# Patient Record
Sex: Female | Born: 1935 | Race: White | Hispanic: No | Marital: Single | Smoking: Current every day smoker
Health system: Southern US, Community
[De-identification: ages and names within clinical notes are randomized; demographics above are authoritative.]

## PROBLEM LIST (undated history)

## (undated) DIAGNOSIS — E785 Hyperlipidemia, unspecified: Secondary | ICD-10-CM

## (undated) DIAGNOSIS — Z8709 Personal history of other diseases of the respiratory system: Secondary | ICD-10-CM

## (undated) DIAGNOSIS — D649 Anemia, unspecified: Secondary | ICD-10-CM

## (undated) DIAGNOSIS — D519 Vitamin B12 deficiency anemia, unspecified: Secondary | ICD-10-CM

## (undated) DIAGNOSIS — I252 Old myocardial infarction: Secondary | ICD-10-CM

## (undated) DIAGNOSIS — N1330 Unspecified hydronephrosis: Secondary | ICD-10-CM

## (undated) DIAGNOSIS — K2980 Duodenitis without bleeding: Secondary | ICD-10-CM

## (undated) DIAGNOSIS — C539 Malignant neoplasm of cervix uteri, unspecified: Secondary | ICD-10-CM

## (undated) DIAGNOSIS — Z87898 Personal history of other specified conditions: Secondary | ICD-10-CM

## (undated) DIAGNOSIS — Z8719 Personal history of other diseases of the digestive system: Secondary | ICD-10-CM

## (undated) DIAGNOSIS — J449 Chronic obstructive pulmonary disease, unspecified: Secondary | ICD-10-CM

## (undated) DIAGNOSIS — D721 Eosinophilia, unspecified: Secondary | ICD-10-CM

## (undated) DIAGNOSIS — R918 Other nonspecific abnormal finding of lung field: Secondary | ICD-10-CM

## (undated) DIAGNOSIS — I2510 Atherosclerotic heart disease of native coronary artery without angina pectoris: Secondary | ICD-10-CM

## (undated) DIAGNOSIS — I509 Heart failure, unspecified: Secondary | ICD-10-CM

## (undated) DIAGNOSIS — Z8701 Personal history of pneumonia (recurrent): Secondary | ICD-10-CM

## (undated) HISTORY — DX: Chronic obstructive pulmonary disease, unspecified: J44.9

## (undated) HISTORY — DX: Atherosclerotic heart disease of native coronary artery without angina pectoris: I25.10

## (undated) HISTORY — DX: Personal history of other diseases of the digestive system: Z87.19

## (undated) HISTORY — DX: Hyperlipidemia, unspecified: E78.5

## (undated) HISTORY — DX: Personal history of other specified conditions: Z87.898

## (undated) HISTORY — DX: Other nonspecific abnormal finding of lung field: R91.8

## (undated) HISTORY — DX: Personal history of pneumonia (recurrent): Z87.01

## (undated) HISTORY — DX: Old myocardial infarction: I25.2

## (undated) HISTORY — DX: Eosinophilia, unspecified: D72.10

## (undated) HISTORY — DX: Vitamin B12 deficiency anemia, unspecified: D51.9

## (undated) HISTORY — DX: Duodenitis without bleeding: K29.80

## (undated) HISTORY — DX: Heart failure, unspecified: I50.9

## (undated) HISTORY — DX: Anemia, unspecified: D64.9

## (undated) HISTORY — DX: Personal history of other diseases of the respiratory system: Z87.09

## (undated) HISTORY — DX: Malignant neoplasm of cervix uteri, unspecified: C53.9

## (undated) HISTORY — DX: Eosinophilia: D72.1

## (undated) HISTORY — DX: Unspecified hydronephrosis: N13.30

---

## 1966-01-31 HISTORY — PX: TOTAL ABDOMINAL HYSTERECTOMY: SHX209

## 1966-01-31 HISTORY — PX: RIGHT OOPHORECTOMY: SHX2359

## 2007-02-01 HISTORY — PX: CATARACT EXTRACTION: SUR2

## 2007-07-12 ENCOUNTER — Ambulatory Visit: Payer: Self-pay | Admitting: Internal Medicine

## 2008-01-16 ENCOUNTER — Ambulatory Visit: Payer: Self-pay | Admitting: Internal Medicine

## 2008-02-01 DIAGNOSIS — Z8719 Personal history of other diseases of the digestive system: Secondary | ICD-10-CM

## 2008-02-01 HISTORY — DX: Personal history of other diseases of the digestive system: Z87.19

## 2008-03-20 ENCOUNTER — Inpatient Hospital Stay: Payer: Self-pay | Admitting: Internal Medicine

## 2008-03-21 ENCOUNTER — Ambulatory Visit: Payer: Self-pay | Admitting: Cardiology

## 2008-03-22 HISTORY — PX: ATHRECTOMY DIRECTIONAL CORONARY: SHX5731

## 2008-03-22 HISTORY — PX: CORONARY ANGIOPLASTY WITH STENT PLACEMENT: SHX49

## 2008-07-15 ENCOUNTER — Ambulatory Visit: Payer: Self-pay | Admitting: Unknown Physician Specialty

## 2008-07-25 ENCOUNTER — Ambulatory Visit: Payer: Self-pay | Admitting: Unknown Physician Specialty

## 2008-07-29 ENCOUNTER — Ambulatory Visit: Payer: Self-pay | Admitting: Unknown Physician Specialty

## 2008-09-08 ENCOUNTER — Ambulatory Visit: Payer: Self-pay | Admitting: Unknown Physician Specialty

## 2009-03-31 ENCOUNTER — Ambulatory Visit: Payer: Self-pay | Admitting: Internal Medicine

## 2009-04-03 ENCOUNTER — Ambulatory Visit: Payer: Self-pay | Admitting: Internal Medicine

## 2009-05-01 ENCOUNTER — Ambulatory Visit: Payer: Self-pay | Admitting: Internal Medicine

## 2009-05-31 ENCOUNTER — Ambulatory Visit: Payer: Self-pay | Admitting: Internal Medicine

## 2009-07-01 ENCOUNTER — Ambulatory Visit: Payer: Self-pay | Admitting: Internal Medicine

## 2009-07-31 ENCOUNTER — Ambulatory Visit: Payer: Self-pay | Admitting: Internal Medicine

## 2009-08-31 ENCOUNTER — Ambulatory Visit: Payer: Self-pay | Admitting: Internal Medicine

## 2009-09-03 ENCOUNTER — Ambulatory Visit: Payer: Self-pay | Admitting: Internal Medicine

## 2009-09-08 ENCOUNTER — Ambulatory Visit: Payer: Self-pay | Admitting: Internal Medicine

## 2009-09-21 ENCOUNTER — Ambulatory Visit: Payer: Self-pay | Admitting: Surgery

## 2009-10-01 ENCOUNTER — Ambulatory Visit: Payer: Self-pay | Admitting: Internal Medicine

## 2009-10-31 ENCOUNTER — Ambulatory Visit: Payer: Self-pay | Admitting: Internal Medicine

## 2009-11-05 ENCOUNTER — Ambulatory Visit: Payer: Self-pay | Admitting: Internal Medicine

## 2009-12-01 ENCOUNTER — Ambulatory Visit: Payer: Self-pay | Admitting: Internal Medicine

## 2009-12-31 ENCOUNTER — Ambulatory Visit: Payer: Self-pay | Admitting: Internal Medicine

## 2010-01-31 ENCOUNTER — Ambulatory Visit: Payer: Self-pay | Admitting: Internal Medicine

## 2010-03-03 ENCOUNTER — Ambulatory Visit: Payer: Self-pay | Admitting: Internal Medicine

## 2010-04-08 ENCOUNTER — Ambulatory Visit: Payer: Self-pay | Admitting: Internal Medicine

## 2010-05-02 ENCOUNTER — Ambulatory Visit: Payer: Self-pay | Admitting: Internal Medicine

## 2010-06-01 ENCOUNTER — Ambulatory Visit: Payer: Self-pay | Admitting: Internal Medicine

## 2010-07-02 ENCOUNTER — Ambulatory Visit: Payer: Self-pay | Admitting: Internal Medicine

## 2010-08-01 ENCOUNTER — Ambulatory Visit: Payer: Self-pay | Admitting: Internal Medicine

## 2010-09-01 ENCOUNTER — Ambulatory Visit: Payer: Self-pay | Admitting: Internal Medicine

## 2010-09-17 ENCOUNTER — Ambulatory Visit: Payer: Self-pay | Admitting: Internal Medicine

## 2010-09-22 ENCOUNTER — Ambulatory Visit: Payer: Self-pay | Admitting: Internal Medicine

## 2010-10-02 ENCOUNTER — Ambulatory Visit: Payer: Self-pay | Admitting: Internal Medicine

## 2010-11-01 ENCOUNTER — Ambulatory Visit: Payer: Self-pay | Admitting: Internal Medicine

## 2010-12-02 ENCOUNTER — Ambulatory Visit: Payer: Self-pay | Admitting: Internal Medicine

## 2011-01-01 ENCOUNTER — Ambulatory Visit: Payer: Self-pay | Admitting: Internal Medicine

## 2011-02-01 ENCOUNTER — Ambulatory Visit: Payer: Self-pay | Admitting: Internal Medicine

## 2011-02-14 LAB — CBC CANCER CENTER
Basophil #: 0.1 x10 3/mm (ref 0.0–0.1)
Basophil %: 0.7 %
Comment - H1-Com1: NORMAL
Eosinophil %: 18.2 %
Eosinophil: 22 %
HCT: 33.9 % — ABNORMAL LOW (ref 35.0–47.0)
HGB: 11.4 g/dL — ABNORMAL LOW (ref 12.0–16.0)
Lymphocyte %: 27.1 %
Lymphocytes: 23 %
MCHC: 33.8 g/dL (ref 32.0–36.0)
Monocyte %: 6.9 %
Monocytes: 6 %
Neutrophil #: 4.2 x10 3/mm (ref 1.4–6.5)
Neutrophil %: 47.1 %
RBC: 2.75 10*6/uL — ABNORMAL LOW (ref 3.80–5.20)
Segmented Neutrophils: 49 %

## 2011-03-04 ENCOUNTER — Ambulatory Visit: Payer: Self-pay | Admitting: Internal Medicine

## 2011-04-20 ENCOUNTER — Ambulatory Visit: Payer: Self-pay | Admitting: Internal Medicine

## 2011-04-20 LAB — CBC CANCER CENTER
Basophil %: 1.1 %
Eosinophil %: 21.2 %
HCT: 34 % — ABNORMAL LOW (ref 35.0–47.0)
HGB: 11.4 g/dL — ABNORMAL LOW (ref 12.0–16.0)
Lymphocyte #: 2.6 x10 3/mm (ref 1.0–3.6)
Lymphocyte %: 28.3 %
MCHC: 33.7 g/dL (ref 32.0–36.0)
MCV: 120 fL — ABNORMAL HIGH (ref 80–100)
Monocyte #: 0.6 x10 3/mm (ref 0.0–0.7)
Neutrophil %: 43.5 %
RDW: 14.6 % — ABNORMAL HIGH (ref 11.5–14.5)
WBC: 9.3 x10 3/mm (ref 3.6–11.0)

## 2011-05-02 ENCOUNTER — Ambulatory Visit: Payer: Self-pay | Admitting: Internal Medicine

## 2011-05-04 LAB — CBC CANCER CENTER
Basophil %: 0.8 %
Eosinophil #: 2.4 x10 3/mm — ABNORMAL HIGH (ref 0.0–0.7)
Eosinophil %: 24.1 %
HGB: 11.2 g/dL — ABNORMAL LOW (ref 12.0–16.0)
Lymphocyte #: 2.7 x10 3/mm (ref 1.0–3.6)
MCH: 40.5 pg — ABNORMAL HIGH (ref 26.0–34.0)
MCHC: 33.6 g/dL (ref 32.0–36.0)
MCV: 121 fL — ABNORMAL HIGH (ref 80–100)
Monocyte #: 0.6 x10 3/mm (ref 0.0–0.7)
Monocyte %: 5.8 %
Neutrophil %: 41.4 %
Platelet: 277 x10 3/mm (ref 150–440)
RDW: 15.9 % — ABNORMAL HIGH (ref 11.5–14.5)
WBC: 9.8 x10 3/mm (ref 3.6–11.0)

## 2011-05-18 LAB — CBC CANCER CENTER
Eosinophil #: 1.8 x10 3/mm — ABNORMAL HIGH (ref 0.0–0.7)
HCT: 31.9 % — ABNORMAL LOW (ref 35.0–47.0)
Lymphocyte #: 2.9 x10 3/mm (ref 1.0–3.6)
Lymphocyte %: 33.1 %
MCH: 40.4 pg — ABNORMAL HIGH (ref 26.0–34.0)
MCHC: 33 g/dL (ref 32.0–36.0)
MCV: 122 fL — ABNORMAL HIGH (ref 80–100)
Monocyte #: 0.6 x10 3/mm (ref 0.2–0.9)
Monocyte %: 6.3 %
WBC: 8.7 x10 3/mm (ref 3.6–11.0)

## 2011-06-01 ENCOUNTER — Ambulatory Visit: Payer: Self-pay | Admitting: Internal Medicine

## 2011-06-01 LAB — IRON AND TIBC
Iron Bind.Cap.(Total): 290 ug/dL (ref 250–450)
Unbound Iron-Bind.Cap.: 205 ug/dL

## 2011-06-01 LAB — CBC CANCER CENTER
Basophil #: 0.1 x10 3/mm (ref 0.0–0.1)
Basophil %: 1.4 %
Eosinophil %: 18.2 %
Lymphocyte %: 25.1 %
MCH: 40.4 pg — ABNORMAL HIGH (ref 26.0–34.0)
MCHC: 32.9 g/dL (ref 32.0–36.0)
MCV: 123 fL — ABNORMAL HIGH (ref 80–100)
Monocyte #: 0.5 x10 3/mm (ref 0.2–0.9)
Monocyte %: 5.8 %
Neutrophil #: 4.3 x10 3/mm (ref 1.4–6.5)
Neutrophil %: 49.5 %
RBC: 2.66 10*6/uL — ABNORMAL LOW (ref 3.80–5.20)
RDW: 15.5 % — ABNORMAL HIGH (ref 11.5–14.5)
WBC: 8.8 x10 3/mm (ref 3.6–11.0)

## 2011-06-02 LAB — PROT IMMUNOELECTROPHORES(ARMC)

## 2011-06-10 ENCOUNTER — Ambulatory Visit: Payer: Self-pay | Admitting: Specialist

## 2011-06-10 LAB — CREATININE, SERUM
Creatinine: 1.04 mg/dL (ref 0.60–1.30)
EGFR (African American): >60

## 2011-06-23 ENCOUNTER — Ambulatory Visit: Payer: Self-pay | Admitting: Specialist

## 2011-06-29 LAB — CBC CANCER CENTER
Eosinophil #: 2.2 x10 3/mm — ABNORMAL HIGH (ref 0.0–0.7)
Eosinophil %: 25.1 %
HGB: 10.8 g/dL — ABNORMAL LOW (ref 12.0–16.0)
MCH: 41.5 pg — ABNORMAL HIGH (ref 26.0–34.0)
MCHC: 32.8 g/dL (ref 32.0–36.0)
Monocyte #: 0.6 x10 3/mm (ref 0.2–0.9)
Neutrophil #: 3.5 x10 3/mm (ref 1.4–6.5)
Neutrophil %: 39.3 %
Platelet: 207 x10 3/mm (ref 150–440)
RDW: 15.8 % — ABNORMAL HIGH (ref 11.5–14.5)
WBC: 8.9 x10 3/mm (ref 3.6–11.0)

## 2011-06-29 LAB — PROTIME-INR: Prothrombin Time: 12.7 secs (ref 11.5–14.7)

## 2011-06-29 LAB — APTT: Activated PTT: 29 secs (ref 23.6–35.9)

## 2011-07-02 ENCOUNTER — Ambulatory Visit: Payer: Self-pay | Admitting: Internal Medicine

## 2011-07-18 ENCOUNTER — Ambulatory Visit: Payer: Self-pay

## 2011-07-18 LAB — BASIC METABOLIC PANEL
Anion Gap: 6 — ABNORMAL LOW (ref 7–16)
Calcium, Total: 8.6 mg/dL (ref 8.5–10.1)
Chloride: 107 mmol/L (ref 98–107)
EGFR (African American): >60
Osmolality: 282 (ref 275–301)
Potassium: 4.8 mmol/L (ref 3.5–5.1)
Sodium: 141 mmol/L (ref 136–145)

## 2011-08-01 ENCOUNTER — Ambulatory Visit: Payer: Self-pay | Admitting: Internal Medicine

## 2011-08-03 LAB — CBC CANCER CENTER
Basophil #: 0.1 x10 3/mm (ref 0.0–0.1)
Eosinophil #: 1.2 x10 3/mm — ABNORMAL HIGH (ref 0.0–0.7)
HCT: 35 % (ref 35.0–47.0)
HGB: 11.3 g/dL — ABNORMAL LOW (ref 12.0–16.0)
Lymphocyte %: 26.8 %
MCHC: 32.3 g/dL (ref 32.0–36.0)
MCV: 129 fL — ABNORMAL HIGH (ref 80–100)
Monocyte #: 0.7 x10 3/mm (ref 0.2–0.9)
Monocyte %: 8 %
Neutrophil #: 4.1 x10 3/mm (ref 1.4–6.5)
RBC: 2.72 10*6/uL — ABNORMAL LOW (ref 3.80–5.20)

## 2011-08-17 LAB — COMPREHENSIVE METABOLIC PANEL
Alkaline Phosphatase: 135 U/L (ref 50–136)
Anion Gap: 9 (ref 7–16)
BUN: 20 mg/dL — ABNORMAL HIGH (ref 7–18)
Calcium, Total: 8.9 mg/dL (ref 8.5–10.1)
Chloride: 103 mmol/L (ref 98–107)
Co2: 27 mmol/L (ref 21–32)
Creatinine: 1.12 mg/dL (ref 0.60–1.30)
Glucose: 100 mg/dL — ABNORMAL HIGH (ref 65–99)
Osmolality: 280 (ref 275–301)
SGOT(AST): 9 U/L — ABNORMAL LOW (ref 15–37)
SGPT (ALT): 14 U/L
Sodium: 139 mmol/L (ref 136–145)

## 2011-08-17 LAB — CBC CANCER CENTER
Basophil #: 0.1 x10 3/mm (ref 0.0–0.1)
Basophil %: 0.9 %
Eosinophil #: 0.9 x10 3/mm — ABNORMAL HIGH (ref 0.0–0.7)
HGB: 11 g/dL — ABNORMAL LOW (ref 12.0–16.0)
Lymphocyte %: 27 %
MCHC: 32.2 g/dL (ref 32.0–36.0)
Neutrophil #: 4.2 x10 3/mm (ref 1.4–6.5)
Neutrophil %: 51.8 %
Platelet: 290 x10 3/mm (ref 150–440)
WBC: 8.2 x10 3/mm (ref 3.6–11.0)

## 2011-08-17 LAB — PROTIME-INR: Prothrombin Time: 13 secs (ref 11.5–14.7)

## 2011-08-17 LAB — APTT: Activated PTT: 31.2 secs (ref 23.6–35.9)

## 2011-08-31 LAB — CBC CANCER CENTER
Basophil %: 0.7 %
Eosinophil %: 19.8 %
HGB: 11.3 g/dL — ABNORMAL LOW (ref 12.0–16.0)
Lymphocyte %: 31.5 %
MCH: 43.2 pg — ABNORMAL HIGH (ref 26.0–34.0)
Monocyte #: 0.4 x10 3/mm (ref 0.2–0.9)
Monocyte %: 5.9 %
Neutrophil %: 42.1 %
RBC: 2.62 10*6/uL — ABNORMAL LOW (ref 3.80–5.20)
WBC: 7.4 x10 3/mm (ref 3.6–11.0)

## 2011-09-01 ENCOUNTER — Ambulatory Visit: Payer: Self-pay | Admitting: Internal Medicine

## 2011-09-01 DIAGNOSIS — N1330 Unspecified hydronephrosis: Secondary | ICD-10-CM

## 2011-09-01 HISTORY — DX: Unspecified hydronephrosis: N13.30

## 2011-09-28 ENCOUNTER — Ambulatory Visit: Payer: Self-pay | Admitting: Internal Medicine

## 2011-09-28 LAB — CBC CANCER CENTER
Basophil %: 1.1 %
Eosinophil %: 17.7 %
HCT: 33.4 % — ABNORMAL LOW (ref 35.0–47.0)
HGB: 11 g/dL — ABNORMAL LOW (ref 12.0–16.0)
Lymphocyte #: 2.5 x10 3/mm (ref 1.0–3.6)
MCV: 129 fL — ABNORMAL HIGH (ref 80–100)
Monocyte %: 6.2 %
Neutrophil #: 2.4 x10 3/mm (ref 1.4–6.5)

## 2011-09-30 ENCOUNTER — Ambulatory Visit: Payer: Self-pay | Admitting: Internal Medicine

## 2011-10-02 ENCOUNTER — Ambulatory Visit: Payer: Self-pay | Admitting: Internal Medicine

## 2011-10-12 ENCOUNTER — Ambulatory Visit: Payer: Self-pay | Admitting: Internal Medicine

## 2011-10-12 LAB — CBC CANCER CENTER
HCT: 33.5 % — ABNORMAL LOW (ref 35.0–47.0)
Lymphocyte #: 2.5 x10 3/mm (ref 1.0–3.6)
MCH: 42.7 pg — ABNORMAL HIGH (ref 26.0–34.0)
MCHC: 33 g/dL (ref 32.0–36.0)
MCV: 130 fL — ABNORMAL HIGH (ref 80–100)
Monocyte %: 6.3 %
Neutrophil #: 3.1 x10 3/mm (ref 1.4–6.5)
Platelet: 180 x10 3/mm (ref 150–440)
RDW: 14.9 % — ABNORMAL HIGH (ref 11.5–14.5)

## 2011-10-18 ENCOUNTER — Encounter: Payer: Self-pay | Admitting: Cardiothoracic Surgery

## 2011-10-26 LAB — CBC CANCER CENTER
Basophil %: 0.7 %
Eosinophil #: 0.9 x10 3/mm — ABNORMAL HIGH (ref 0.0–0.7)
HGB: 10.9 g/dL — ABNORMAL LOW (ref 12.0–16.0)
Lymphocyte #: 2.8 x10 3/mm (ref 1.0–3.6)
Lymphocyte %: 41 %
MCH: 42.6 pg — ABNORMAL HIGH (ref 26.0–34.0)
MCHC: 32.4 g/dL (ref 32.0–36.0)
MCV: 131 fL — ABNORMAL HIGH (ref 80–100)
Monocyte #: 0.5 x10 3/mm (ref 0.2–0.9)
Neutrophil %: 38.2 %
Platelet: 229 x10 3/mm (ref 150–440)

## 2011-11-01 ENCOUNTER — Ambulatory Visit: Payer: Self-pay | Admitting: Internal Medicine

## 2011-11-01 ENCOUNTER — Encounter: Payer: Self-pay | Admitting: Cardiothoracic Surgery

## 2011-11-17 ENCOUNTER — Ambulatory Visit: Payer: Self-pay | Admitting: Internal Medicine

## 2011-11-17 LAB — CREATININE, SERUM
Creatinine: 1.21 mg/dL (ref 0.60–1.30)
EGFR (African American): 51 — ABNORMAL LOW

## 2011-12-01 LAB — CBC CANCER CENTER
Basophil #: 0.1 x10 3/mm (ref 0.0–0.1)
Basophil %: 0.8 %
Eosinophil #: 0.9 x10 3/mm — ABNORMAL HIGH (ref 0.0–0.7)
HCT: 32.5 % — ABNORMAL LOW (ref 35.0–47.0)
Lymphocyte #: 2.2 x10 3/mm (ref 1.0–3.6)
MCH: 43.4 pg — ABNORMAL HIGH (ref 26.0–34.0)
MCHC: 32.5 g/dL (ref 32.0–36.0)
MCV: 134 fL — ABNORMAL HIGH (ref 80–100)
Monocyte #: 0.5 x10 3/mm (ref 0.2–0.9)
Neutrophil #: 2.9 x10 3/mm (ref 1.4–6.5)
RDW: 14.9 % — ABNORMAL HIGH (ref 11.5–14.5)

## 2011-12-02 ENCOUNTER — Encounter: Payer: Self-pay | Admitting: Cardiothoracic Surgery

## 2011-12-02 ENCOUNTER — Ambulatory Visit: Payer: Self-pay | Admitting: Internal Medicine

## 2011-12-26 LAB — FERRITIN: Ferritin (ARMC): 172 ng/mL (ref 8–388)

## 2011-12-26 LAB — CBC CANCER CENTER
Basophil #: 0 x10 3/mm (ref 0.0–0.1)
Eosinophil #: 1 x10 3/mm — ABNORMAL HIGH (ref 0.0–0.7)
Eosinophil %: 15 %
HCT: 31.9 % — ABNORMAL LOW (ref 35.0–47.0)
Lymphocyte #: 2.2 x10 3/mm (ref 1.0–3.6)
MCV: 135 fL — ABNORMAL HIGH (ref 80–100)
Monocyte %: 6.6 %
Neutrophil #: 2.8 x10 3/mm (ref 1.4–6.5)
Neutrophil %: 43.5 %
Platelet: 213 x10 3/mm (ref 150–440)
RBC: 2.36 10*6/uL — ABNORMAL LOW (ref 3.80–5.20)
RDW: 14 % (ref 11.5–14.5)
WBC: 6.5 x10 3/mm (ref 3.6–11.0)

## 2011-12-26 LAB — IRON AND TIBC: Iron Bind.Cap.(Total): 300 ug/dL (ref 250–450)

## 2012-01-01 ENCOUNTER — Ambulatory Visit: Payer: Self-pay | Admitting: Internal Medicine

## 2012-01-01 ENCOUNTER — Encounter: Payer: Self-pay | Admitting: Cardiothoracic Surgery

## 2012-01-19 LAB — CBC CANCER CENTER
Basophil #: 0.1 x10 3/mm (ref 0.0–0.1)
Eosinophil #: 1.1 x10 3/mm — ABNORMAL HIGH (ref 0.0–0.7)
Eosinophil %: 15 %
Lymphocyte #: 2.7 x10 3/mm (ref 1.0–3.6)
MCHC: 33.6 g/dL (ref 32.0–36.0)
MCV: 129 fL — ABNORMAL HIGH (ref 80–100)
Monocyte #: 0.5 x10 3/mm (ref 0.2–0.9)
Platelet: 304 x10 3/mm (ref 150–440)
RDW: 13.8 % (ref 11.5–14.5)
WBC: 7.1 x10 3/mm (ref 3.6–11.0)

## 2012-02-01 ENCOUNTER — Ambulatory Visit: Payer: Self-pay | Admitting: Internal Medicine

## 2012-02-16 LAB — CBC CANCER CENTER
Basophil #: 0.1 x10 3/mm (ref 0.0–0.1)
Basophil %: 1.2 %
Eosinophil #: 1.4 x10 3/mm — ABNORMAL HIGH (ref 0.0–0.7)
Eosinophil %: 18.5 %
HCT: 32.6 % — ABNORMAL LOW (ref 35.0–47.0)
HGB: 11.3 g/dL — ABNORMAL LOW (ref 12.0–16.0)
MCHC: 34.6 g/dL (ref 32.0–36.0)
MCV: 128 fL — ABNORMAL HIGH (ref 80–100)
Neutrophil #: 2.9 x10 3/mm (ref 1.4–6.5)
Neutrophil %: 38.8 %
Platelet: 145 x10 3/mm — ABNORMAL LOW (ref 150–440)
RBC: 2.55 10*6/uL — ABNORMAL LOW (ref 3.80–5.20)
RDW: 14.4 % (ref 11.5–14.5)
WBC: 7.5 x10 3/mm (ref 3.6–11.0)

## 2012-02-27 LAB — CBC CANCER CENTER
Eosinophil %: 12.9 %
HCT: 33 % — ABNORMAL LOW (ref 35.0–47.0)
HGB: 11.1 g/dL — ABNORMAL LOW (ref 12.0–16.0)
Lymphocyte #: 2.1 x10 3/mm (ref 1.0–3.6)
MCHC: 33.7 g/dL (ref 32.0–36.0)
MCV: 129 fL — ABNORMAL HIGH (ref 80–100)
Monocyte #: 0.5 x10 3/mm (ref 0.2–0.9)
Monocyte %: 8.2 %
Neutrophil #: 2.6 x10 3/mm (ref 1.4–6.5)
Neutrophil %: 43.5 %
RBC: 2.55 10*6/uL — ABNORMAL LOW (ref 3.80–5.20)
WBC: 6 x10 3/mm (ref 3.6–11.0)

## 2012-02-27 LAB — CREATININE, SERUM
Creatinine: 1.24 mg/dL (ref 0.60–1.30)
EGFR (African American): 49 — ABNORMAL LOW
EGFR (Non-African Amer.): 42 — ABNORMAL LOW

## 2012-03-03 ENCOUNTER — Ambulatory Visit: Payer: Self-pay | Admitting: Internal Medicine

## 2012-03-31 ENCOUNTER — Ambulatory Visit: Payer: Self-pay | Admitting: Internal Medicine

## 2012-04-16 LAB — CBC CANCER CENTER
Eosinophil #: 1.1 x10 3/mm — ABNORMAL HIGH (ref 0.0–0.7)
HCT: 31.8 % — ABNORMAL LOW (ref 35.0–47.0)
Lymphocyte %: 31.3 %
MCH: 43.7 pg — ABNORMAL HIGH (ref 26.0–34.0)
MCHC: 33.7 g/dL (ref 32.0–36.0)
MCV: 130 fL — ABNORMAL HIGH (ref 80–100)
Monocyte %: 7.6 %
Neutrophil #: 2.4 x10 3/mm (ref 1.4–6.5)
Neutrophil %: 41.7 %
Platelet: 215 x10 3/mm (ref 150–440)
RBC: 2.45 10*6/uL — ABNORMAL LOW (ref 3.80–5.20)
RDW: 15.5 % — ABNORMAL HIGH (ref 11.5–14.5)
WBC: 5.7 x10 3/mm (ref 3.6–11.0)

## 2012-05-01 ENCOUNTER — Ambulatory Visit: Payer: Self-pay | Admitting: Internal Medicine

## 2012-05-17 LAB — CBC CANCER CENTER
Basophil #: 0 x10 3/mm (ref 0.0–0.1)
Basophil %: 0.5 %
Eosinophil #: 0.6 x10 3/mm (ref 0.0–0.7)
Eosinophil %: 8.4 %
HGB: 9.9 g/dL — ABNORMAL LOW (ref 12.0–16.0)
Lymphocyte #: 2.2 x10 3/mm (ref 1.0–3.6)
Lymphocyte %: 28.4 %
MCHC: 33.5 g/dL (ref 32.0–36.0)
MCV: 132 fL — ABNORMAL HIGH (ref 80–100)
Monocyte %: 4.8 %
Platelet: 194 x10 3/mm (ref 150–440)
RBC: 2.24 10*6/uL — ABNORMAL LOW (ref 3.80–5.20)
RDW: 14.4 % (ref 11.5–14.5)
WBC: 7.8 x10 3/mm (ref 3.6–11.0)

## 2012-05-31 ENCOUNTER — Ambulatory Visit: Payer: Self-pay | Admitting: Internal Medicine

## 2012-05-31 LAB — CBC CANCER CENTER
Eosinophil #: 1.1 x10 3/mm — ABNORMAL HIGH (ref 0.0–0.7)
Eosinophil %: 13.8 %
Lymphocyte #: 2.4 x10 3/mm (ref 1.0–3.6)
Lymphocyte %: 29.6 %
MCH: 43.5 pg — ABNORMAL HIGH (ref 26.0–34.0)
MCV: 132 fL — ABNORMAL HIGH (ref 80–100)
Monocyte %: 5.9 %
RBC: 2.34 10*6/uL — ABNORMAL LOW (ref 3.80–5.20)
WBC: 8 x10 3/mm (ref 3.6–11.0)

## 2012-05-31 LAB — IRON AND TIBC
Iron Bind.Cap.(Total): 318 ug/dL (ref 250–450)
Iron Saturation: 27 %
Iron: 87 ug/dL (ref 50–170)

## 2012-05-31 LAB — CREATININE, SERUM
Creatinine: 1.16 mg/dL (ref 0.60–1.30)
EGFR (African American): 53 — ABNORMAL LOW
EGFR (Non-African Amer.): 46 — ABNORMAL LOW

## 2012-05-31 LAB — FERRITIN: Ferritin (ARMC): 161 ng/mL (ref 8–388)

## 2012-06-21 LAB — CBC CANCER CENTER
Basophil #: 0 x10 3/mm (ref 0.0–0.1)
Basophil %: 0.6 %
Eosinophil #: 0.6 x10 3/mm (ref 0.0–0.7)
Eosinophil %: 9.8 %
HCT: 28.9 % — ABNORMAL LOW (ref 35.0–47.0)
HGB: 9.7 g/dL — ABNORMAL LOW (ref 12.0–16.0)
Lymphocyte #: 2.7 x10 3/mm (ref 1.0–3.6)
Lymphocyte %: 43.9 %
MCHC: 33.5 g/dL (ref 32.0–36.0)
MCV: 132 fL — ABNORMAL HIGH (ref 80–100)
Monocyte #: 0.5 x10 3/mm (ref 0.2–0.9)
Monocyte %: 8.1 %
Neutrophil #: 2.4 x10 3/mm (ref 1.4–6.5)
Neutrophil %: 37.6 %
RDW: 15.3 % — ABNORMAL HIGH (ref 11.5–14.5)

## 2012-07-01 ENCOUNTER — Ambulatory Visit: Payer: Self-pay | Admitting: Internal Medicine

## 2012-07-12 LAB — CBC CANCER CENTER
Basophil %: 1.1 %
Eosinophil #: 0.9 x10 3/mm — ABNORMAL HIGH (ref 0.0–0.7)
Eosinophil %: 15.5 %
HCT: 29.9 % — ABNORMAL LOW (ref 35.0–47.0)
HGB: 10.2 g/dL — ABNORMAL LOW (ref 12.0–16.0)
MCV: 132 fL — ABNORMAL HIGH (ref 80–100)
Monocyte #: 0.4 x10 3/mm (ref 0.2–0.9)
Monocyte %: 6.8 %
Neutrophil #: 2.5 x10 3/mm (ref 1.4–6.5)
Neutrophil %: 41.2 %
Platelet: 241 x10 3/mm (ref 150–440)
RBC: 2.27 10*6/uL — ABNORMAL LOW (ref 3.80–5.20)

## 2012-07-31 ENCOUNTER — Ambulatory Visit: Payer: Self-pay | Admitting: Internal Medicine

## 2012-08-20 LAB — CBC CANCER CENTER
Basophil %: 1.4 %
Eosinophil #: 1.1 x10 3/mm — ABNORMAL HIGH (ref 0.0–0.7)
HCT: 29.8 % — ABNORMAL LOW (ref 35.0–47.0)
HGB: 10.2 g/dL — ABNORMAL LOW (ref 12.0–16.0)
Lymphocyte #: 2.2 x10 3/mm (ref 1.0–3.6)
MCHC: 34.1 g/dL (ref 32.0–36.0)
MCV: 128 fL — ABNORMAL HIGH (ref 80–100)
Neutrophil %: 42.3 %
RDW: 14.5 % (ref 11.5–14.5)

## 2012-08-31 ENCOUNTER — Ambulatory Visit: Payer: Self-pay | Admitting: Internal Medicine

## 2012-09-17 LAB — CBC CANCER CENTER
Basophil #: 0.1 x10 3/mm (ref 0.0–0.1)
HGB: 10.2 g/dL — ABNORMAL LOW (ref 12.0–16.0)
Lymphocyte #: 2.1 x10 3/mm (ref 1.0–3.6)
Lymphocyte %: 31.2 %
MCH: 44.2 pg — ABNORMAL HIGH (ref 26.0–34.0)
MCHC: 34.7 g/dL (ref 32.0–36.0)
Monocyte #: 0.5 x10 3/mm (ref 0.2–0.9)
Monocyte %: 8 %
WBC: 6.8 x10 3/mm (ref 3.6–11.0)

## 2012-10-01 ENCOUNTER — Ambulatory Visit: Payer: Self-pay | Admitting: Internal Medicine

## 2012-10-18 LAB — CBC CANCER CENTER
Basophil #: 0.1 x10 3/mm (ref 0.0–0.1)
Basophil %: 1.1 %
Eosinophil #: 1.3 x10 3/mm — ABNORMAL HIGH (ref 0.0–0.7)
Eosinophil %: 16.4 %
HGB: 10.6 g/dL — ABNORMAL LOW (ref 12.0–16.0)
Lymphocyte #: 2.8 x10 3/mm (ref 1.0–3.6)
MCH: 42.1 pg — ABNORMAL HIGH (ref 26.0–34.0)
MCV: 128 fL — ABNORMAL HIGH (ref 80–100)
Monocyte #: 0.4 x10 3/mm (ref 0.2–0.9)
Monocyte %: 5.5 %
Neutrophil #: 3.4 x10 3/mm (ref 1.4–6.5)
Neutrophil %: 42.7 %
Platelet: 323 x10 3/mm (ref 150–440)
RBC: 2.52 10*6/uL — ABNORMAL LOW (ref 3.80–5.20)
WBC: 8 x10 3/mm (ref 3.6–11.0)

## 2012-10-31 ENCOUNTER — Ambulatory Visit: Payer: Self-pay | Admitting: Internal Medicine

## 2012-11-15 LAB — CBC CANCER CENTER
Basophil %: 1 %
Eosinophil #: 1.4 x10 3/mm — ABNORMAL HIGH (ref 0.0–0.7)
Eosinophil %: 14.5 %
HCT: 30 % — ABNORMAL LOW (ref 35.0–47.0)
MCHC: 33.9 g/dL (ref 32.0–36.0)
Neutrophil #: 5.2 x10 3/mm (ref 1.4–6.5)
Neutrophil %: 54.8 %
RDW: 15.3 % — ABNORMAL HIGH (ref 11.5–14.5)

## 2012-12-01 ENCOUNTER — Ambulatory Visit: Payer: Self-pay | Admitting: Internal Medicine

## 2012-12-26 LAB — CREATININE, SERUM: EGFR (Non-African Amer.): 47 — ABNORMAL LOW

## 2012-12-26 LAB — CBC CANCER CENTER
Basophil #: 0.1 x10 3/mm (ref 0.0–0.1)
HCT: 30.9 % — ABNORMAL LOW (ref 35.0–47.0)
Lymphocyte #: 2.4 x10 3/mm (ref 1.0–3.6)
MCV: 129 fL — ABNORMAL HIGH (ref 80–100)
Monocyte #: 0.4 x10 3/mm (ref 0.2–0.9)
Monocyte %: 4.8 %
Platelet: 302 x10 3/mm (ref 150–440)
RDW: 15.9 % — ABNORMAL HIGH (ref 11.5–14.5)
WBC: 8.7 x10 3/mm (ref 3.6–11.0)

## 2012-12-31 ENCOUNTER — Ambulatory Visit: Payer: Self-pay | Admitting: Internal Medicine

## 2012-12-31 DIAGNOSIS — Z8709 Personal history of other diseases of the respiratory system: Secondary | ICD-10-CM

## 2012-12-31 DIAGNOSIS — I252 Old myocardial infarction: Secondary | ICD-10-CM

## 2012-12-31 HISTORY — DX: Old myocardial infarction: I25.2

## 2012-12-31 HISTORY — DX: Personal history of other diseases of the respiratory system: Z87.09

## 2013-01-06 ENCOUNTER — Inpatient Hospital Stay: Payer: Self-pay | Admitting: Internal Medicine

## 2013-01-06 LAB — URINALYSIS, COMPLETE
Bacteria: NONE SEEN
Bilirubin,UR: NEGATIVE
Glucose,UR: NEGATIVE mg/dL (ref 0–75)
Ketone: NEGATIVE
Nitrite: NEGATIVE
Ph: 6 (ref 4.5–8.0)
Protein: NEGATIVE
RBC,UR: <1 /HPF (ref 0–5)
WBC UR: 24 /HPF (ref 0–5)

## 2013-01-06 LAB — COMPREHENSIVE METABOLIC PANEL
Chloride: 103 mmol/L (ref 98–107)
Co2: 24 mmol/L (ref 21–32)
Creatinine: 0.99 mg/dL (ref 0.60–1.30)
Glucose: 120 mg/dL — ABNORMAL HIGH (ref 65–99)
Osmolality: 274 (ref 275–301)
Potassium: 4.8 mmol/L (ref 3.5–5.1)
SGOT(AST): 19 U/L (ref 15–37)
SGPT (ALT): 12 U/L (ref 12–78)
Total Protein: 7.2 g/dL (ref 6.4–8.2)

## 2013-01-06 LAB — CBC
MCHC: 33.6 g/dL (ref 32.0–36.0)
Platelet: 367 10*3/uL (ref 150–440)
RBC: 2.17 10*6/uL — ABNORMAL LOW (ref 3.80–5.20)

## 2013-01-06 LAB — TROPONIN I: Troponin-I: <0.02 ng/mL

## 2013-01-07 LAB — BASIC METABOLIC PANEL
Anion Gap: 9 (ref 7–16)
BUN: 14 mg/dL (ref 7–18)
Calcium, Total: 8.4 mg/dL — ABNORMAL LOW (ref 8.5–10.1)
Creatinine: 0.9 mg/dL (ref 0.60–1.30)
EGFR (African American): >60
EGFR (Non-African Amer.): >60
Glucose: 139 mg/dL — ABNORMAL HIGH (ref 65–99)
Potassium: 4.3 mmol/L (ref 3.5–5.1)
Sodium: 140 mmol/L (ref 136–145)

## 2013-01-07 LAB — CBC WITH DIFFERENTIAL/PLATELET
Eosinophil %: 0 %
HCT: 23.1 % — ABNORMAL LOW (ref 35.0–47.0)
HGB: 7.5 g/dL — ABNORMAL LOW (ref 12.0–16.0)
Lymphocyte %: 4.4 %
MCH: 42 pg — ABNORMAL HIGH (ref 26.0–34.0)
MCHC: 32.4 g/dL (ref 32.0–36.0)
Monocyte %: 2.3 %
Platelet: 319 10*3/uL (ref 150–440)
RDW: 16.1 % — ABNORMAL HIGH (ref 11.5–14.5)
WBC: 11.2 10*3/uL — ABNORMAL HIGH (ref 3.6–11.0)

## 2013-01-07 LAB — MAGNESIUM: Magnesium: 2.2 mg/dL

## 2013-01-07 LAB — IRON AND TIBC
Iron: 32 ug/dL — ABNORMAL LOW (ref 50–170)
Unbound Iron-Bind.Cap.: 179 ug/dL

## 2013-01-07 LAB — FERRITIN: Ferritin (ARMC): 254 ng/mL (ref 8–388)

## 2013-01-08 LAB — CBC WITH DIFFERENTIAL/PLATELET
Basophil #: 0 10*3/uL (ref 0.0–0.1)
Eosinophil #: 0 10*3/uL (ref 0.0–0.7)
Eosinophil %: 0 %
HCT: 25.8 % — ABNORMAL LOW (ref 35.0–47.0)
Lymphocyte #: 0.9 10*3/uL — ABNORMAL LOW (ref 1.0–3.6)
MCV: 130 fL — ABNORMAL HIGH (ref 80–100)
Monocyte #: 0.6 x10 3/mm (ref 0.2–0.9)
Monocyte %: 5 %
Neutrophil #: 11.1 10*3/uL — ABNORMAL HIGH (ref 1.4–6.5)
Neutrophil %: 88.1 %
RBC: 1.99 10*6/uL — ABNORMAL LOW (ref 3.80–5.20)
WBC: 12.6 10*3/uL — ABNORMAL HIGH (ref 3.6–11.0)

## 2013-01-08 LAB — BASIC METABOLIC PANEL
BUN: 24 mg/dL — ABNORMAL HIGH (ref 7–18)
Chloride: 108 mmol/L — ABNORMAL HIGH (ref 98–107)
Creatinine: 1.22 mg/dL (ref 0.60–1.30)
EGFR (African American): 50 — ABNORMAL LOW
EGFR (Non-African Amer.): 43 — ABNORMAL LOW
Glucose: 144 mg/dL — ABNORMAL HIGH (ref 65–99)
Potassium: 4.3 mmol/L (ref 3.5–5.1)
Sodium: 137 mmol/L (ref 136–145)

## 2013-01-08 LAB — CK TOTAL AND CKMB (NOT AT ARMC): CK-MB: 40.2 ng/mL — ABNORMAL HIGH (ref 0.5–3.6)

## 2013-01-08 LAB — MAGNESIUM: Magnesium: 2 mg/dL

## 2013-01-08 LAB — HEPATIC FUNCTION PANEL A (ARMC)
Albumin: 3.2 g/dL — ABNORMAL LOW (ref 3.4–5.0)
Bilirubin, Direct: 0.2 mg/dL (ref 0.00–0.20)
Bilirubin,Total: 0.4 mg/dL (ref 0.2–1.0)
SGOT(AST): 46 U/L — ABNORMAL HIGH (ref 15–37)
Total Protein: 6.6 g/dL (ref 6.4–8.2)

## 2013-01-08 LAB — URINE CULTURE

## 2013-01-08 LAB — LIPASE, BLOOD: Lipase: 77 U/L (ref 73–393)

## 2013-01-09 LAB — CBC WITH DIFFERENTIAL/PLATELET
Basophil #: 0 10*3/uL (ref 0.0–0.1)
Basophil %: 0.1 %
Eosinophil #: 0 10*3/uL (ref 0.0–0.7)
HGB: 7.6 g/dL — ABNORMAL LOW (ref 12.0–16.0)
Lymphocyte %: 2.8 %
MCH: 39.7 pg — ABNORMAL HIGH (ref 26.0–34.0)
MCHC: 31.3 g/dL — ABNORMAL LOW (ref 32.0–36.0)
Monocyte #: 1.4 x10 3/mm — ABNORMAL HIGH (ref 0.2–0.9)
Neutrophil %: 87.5 %
Platelet: 398 10*3/uL (ref 150–440)
RBC: 1.92 10*6/uL — ABNORMAL LOW (ref 3.80–5.20)
WBC: 14.3 10*3/uL — ABNORMAL HIGH (ref 3.6–11.0)

## 2013-01-09 LAB — BASIC METABOLIC PANEL
Anion Gap: 9 (ref 7–16)
BUN: 26 mg/dL — ABNORMAL HIGH (ref 7–18)
Creatinine: 1.03 mg/dL (ref 0.60–1.30)
EGFR (Non-African Amer.): 53 — ABNORMAL LOW
Glucose: 137 mg/dL — ABNORMAL HIGH (ref 65–99)
Osmolality: 284 (ref 275–301)

## 2013-01-09 LAB — CK-MB
CK-MB: 17.7 ng/mL — ABNORMAL HIGH (ref 0.5–3.6)
CK-MB: 14.6 ng/mL — ABNORMAL HIGH (ref 0.5–3.6)

## 2013-01-09 LAB — TROPONIN I
Troponin-I: 3.2 ng/mL — ABNORMAL HIGH
Troponin-I: 3 ng/mL — ABNORMAL HIGH

## 2013-01-10 LAB — CBC WITH DIFFERENTIAL/PLATELET
Bands: 1 %
HCT: 27 % — ABNORMAL LOW (ref 35.0–47.0)
HGB: 8.7 g/dL — ABNORMAL LOW (ref 12.0–16.0)
Lymphocytes: 6 %
MCH: 39.7 pg — ABNORMAL HIGH (ref 26.0–34.0)
MCHC: 32.3 g/dL (ref 32.0–36.0)
NRBC/100 WBC: 1 /
Platelet: 403 10*3/uL (ref 150–440)
RBC: 2.19 10*6/uL — ABNORMAL LOW (ref 3.80–5.20)
Segmented Neutrophils: 84 %
WBC: 13.7 10*3/uL — ABNORMAL HIGH (ref 3.6–11.0)

## 2013-01-10 LAB — LIPID PANEL
HDL Cholesterol: 26 mg/dL — ABNORMAL LOW (ref 40–60)
Ldl Cholesterol, Calc: 61 mg/dL (ref 0–100)
Triglycerides: 153 mg/dL (ref 0–200)
VLDL Cholesterol, Calc: 31 mg/dL (ref 5–40)

## 2013-01-10 LAB — BASIC METABOLIC PANEL
Anion Gap: 7 (ref 7–16)
Calcium, Total: 8.6 mg/dL (ref 8.5–10.1)
Chloride: 104 mmol/L (ref 98–107)
Creatinine: 1.19 mg/dL (ref 0.60–1.30)
EGFR (African American): 51 — ABNORMAL LOW
EGFR (Non-African Amer.): 44 — ABNORMAL LOW
Glucose: 146 mg/dL — ABNORMAL HIGH (ref 65–99)

## 2013-01-10 LAB — TROPONIN I: Troponin-I: 2.7 ng/mL — ABNORMAL HIGH

## 2013-01-10 LAB — CK-MB: CK-MB: 11.3 ng/mL — ABNORMAL HIGH (ref 0.5–3.6)

## 2013-01-11 LAB — CBC WITH DIFFERENTIAL/PLATELET
Bands: 5 %
HCT: 25.4 % — ABNORMAL LOW (ref 35.0–47.0)
Lymphocytes: 15 %
MCH: 40.8 pg — ABNORMAL HIGH (ref 26.0–34.0)
MCHC: 33.1 g/dL (ref 32.0–36.0)
MCV: 123 fL — ABNORMAL HIGH (ref 80–100)
Myelocyte: 1 %
RBC: 2.06 10*6/uL — ABNORMAL LOW (ref 3.80–5.20)
RDW: 22 % — ABNORMAL HIGH (ref 11.5–14.5)
Segmented Neutrophils: 69 %
WBC: 11.3 10*3/uL — ABNORMAL HIGH (ref 3.6–11.0)

## 2013-01-11 LAB — CULTURE, BLOOD (SINGLE)

## 2013-01-11 LAB — EXPECTORATED SPUTUM ASSESSMENT W GRAM STAIN, RFLX TO RESP C

## 2013-01-11 LAB — PHOSPHORUS: Phosphorus: 3.2 mg/dL (ref 2.5–4.9)

## 2013-01-12 LAB — BASIC METABOLIC PANEL
Anion Gap: 5 — ABNORMAL LOW (ref 7–16)
Calcium, Total: 8.4 mg/dL — ABNORMAL LOW (ref 8.5–10.1)
Co2: 29 mmol/L (ref 21–32)
Glucose: 124 mg/dL — ABNORMAL HIGH (ref 65–99)
Osmolality: 285 (ref 275–301)
Potassium: 4.4 mmol/L (ref 3.5–5.1)
Sodium: 138 mmol/L (ref 136–145)

## 2013-01-12 LAB — CBC WITH DIFFERENTIAL/PLATELET
Bands: 5 %
HCT: 24.5 % — ABNORMAL LOW (ref 35.0–47.0)
HGB: 8.1 g/dL — ABNORMAL LOW (ref 12.0–16.0)
Lymphocytes: 8 %
MCH: 40.8 pg — ABNORMAL HIGH (ref 26.0–34.0)
MCHC: 33.2 g/dL (ref 32.0–36.0)
MCV: 123 fL — ABNORMAL HIGH (ref 80–100)
Monocytes: 8 %
Myelocyte: 3 %
NRBC/100 WBC: 3 /
RDW: 21.3 % — ABNORMAL HIGH (ref 11.5–14.5)
Segmented Neutrophils: 75 %
WBC: 9.4 10*3/uL (ref 3.6–11.0)

## 2013-01-12 LAB — MAGNESIUM: Magnesium: 2.8 mg/dL — ABNORMAL HIGH

## 2013-01-13 LAB — CBC WITH DIFFERENTIAL/PLATELET
Bands: 1 %
HCT: 25.1 % — ABNORMAL LOW (ref 35.0–47.0)
MCH: 40.2 pg — ABNORMAL HIGH (ref 26.0–34.0)
Metamyelocyte: 2 %
Monocytes: 7 %
Myelocyte: 3 %
NRBC/100 WBC: 5 /
Platelet: 295 10*3/uL (ref 150–440)
RBC: 2.03 10*6/uL — ABNORMAL LOW (ref 3.80–5.20)
RDW: 21.5 % — ABNORMAL HIGH (ref 11.5–14.5)
Segmented Neutrophils: 72 %
WBC: 9.6 10*3/uL (ref 3.6–11.0)

## 2013-01-16 LAB — CREATININE, SERUM
EGFR (African American): >60
EGFR (Non-African Amer.): >60

## 2013-01-31 ENCOUNTER — Ambulatory Visit: Payer: Self-pay | Admitting: Internal Medicine

## 2013-04-25 ENCOUNTER — Ambulatory Visit: Payer: Self-pay | Admitting: Internal Medicine

## 2013-04-26 LAB — CBC CANCER CENTER
BASOS PCT: 0.6 %
Basophil #: 0.1 x10 3/mm (ref 0.0–0.1)
EOS ABS: 0.3 x10 3/mm (ref 0.0–0.7)
EOS PCT: 4.2 %
HCT: 27.5 % — ABNORMAL LOW (ref 35.0–47.0)
HGB: 8.9 g/dL — ABNORMAL LOW (ref 12.0–16.0)
LYMPHS ABS: 2.6 x10 3/mm (ref 1.0–3.6)
LYMPHS PCT: 32.6 %
MCH: 45.4 pg — ABNORMAL HIGH (ref 26.0–34.0)
MCHC: 32.5 g/dL (ref 32.0–36.0)
MCV: 140 fL — AB (ref 80–100)
Monocyte #: 0.5 x10 3/mm (ref 0.2–0.9)
Monocyte %: 5.6 %
Neutrophil #: 4.6 x10 3/mm (ref 1.4–6.5)
Neutrophil %: 57 %
Platelet: 336 x10 3/mm (ref 150–440)
RBC: 1.96 10*6/uL — ABNORMAL LOW (ref 3.80–5.20)
RDW: 15.4 % — ABNORMAL HIGH (ref 11.5–14.5)
WBC: 8 x10 3/mm (ref 3.6–11.0)

## 2013-04-26 LAB — FERRITIN: Ferritin (ARMC): 714 ng/mL — ABNORMAL HIGH (ref 8–388)

## 2013-04-26 LAB — IRON AND TIBC
IRON BIND. CAP.(TOTAL): 254 ug/dL (ref 250–450)
IRON SATURATION: 36 %
IRON: 91 ug/dL (ref 50–170)
UNBOUND IRON-BIND. CAP.: 163 ug/dL

## 2013-05-01 ENCOUNTER — Ambulatory Visit: Payer: Self-pay | Admitting: Internal Medicine

## 2013-05-06 LAB — CBC CANCER CENTER
BASOS ABS: 0.1 x10 3/mm (ref 0.0–0.1)
Basophil %: 1.4 %
Eosinophil #: 0.7 x10 3/mm (ref 0.0–0.7)
Eosinophil %: 8.4 %
HCT: 32.7 % — ABNORMAL LOW (ref 35.0–47.0)
HGB: 10.9 g/dL — ABNORMAL LOW (ref 12.0–16.0)
Lymphocyte #: 2.1 x10 3/mm (ref 1.0–3.6)
Lymphocyte %: 27.2 %
MCH: 41.4 pg — ABNORMAL HIGH (ref 26.0–34.0)
MCHC: 33.3 g/dL (ref 32.0–36.0)
MCV: 124 fL — ABNORMAL HIGH (ref 80–100)
Monocyte #: 0.5 x10 3/mm (ref 0.2–0.9)
Monocyte %: 5.9 %
Neutrophil #: 4.5 x10 3/mm (ref 1.4–6.5)
Neutrophil %: 57.1 %
Platelet: 411 x10 3/mm (ref 150–440)
RBC: 2.63 10*6/uL — ABNORMAL LOW (ref 3.80–5.20)
RDW: 26.9 % — AB (ref 11.5–14.5)
WBC: 7.8 x10 3/mm (ref 3.6–11.0)

## 2013-05-15 LAB — CBC CANCER CENTER
BASOS ABS: 0.1 x10 3/mm (ref 0.0–0.1)
Basophil %: 1.1 %
EOS ABS: 1.2 x10 3/mm — AB (ref 0.0–0.7)
Eosinophil %: 14.4 %
HCT: 33.7 % — AB (ref 35.0–47.0)
HGB: 10.8 g/dL — AB (ref 12.0–16.0)
LYMPHS PCT: 34.8 %
Lymphocyte #: 3 x10 3/mm (ref 1.0–3.6)
MCH: 40.1 pg — ABNORMAL HIGH (ref 26.0–34.0)
MCHC: 32.2 g/dL (ref 32.0–36.0)
MCV: 125 fL — AB (ref 80–100)
MONOS PCT: 7.5 %
Monocyte #: 0.6 x10 3/mm (ref 0.2–0.9)
Neutrophil #: 3.7 x10 3/mm (ref 1.4–6.5)
Neutrophil %: 42.2 %
Platelet: 353 x10 3/mm (ref 150–440)
RBC: 2.7 10*6/uL — ABNORMAL LOW (ref 3.80–5.20)
RDW: 25.3 % — ABNORMAL HIGH (ref 11.5–14.5)
WBC: 8.7 x10 3/mm (ref 3.6–11.0)

## 2013-05-29 LAB — CBC CANCER CENTER
BASOS ABS: 0.1 x10 3/mm (ref 0.0–0.1)
Basophil %: 1.1 %
EOS PCT: 21.6 %
Eosinophil #: 2.5 x10 3/mm — ABNORMAL HIGH (ref 0.0–0.7)
HCT: 31 % — ABNORMAL LOW (ref 35.0–47.0)
HGB: 10.3 g/dL — ABNORMAL LOW (ref 12.0–16.0)
LYMPHS PCT: 27.6 %
Lymphocyte #: 3.2 x10 3/mm (ref 1.0–3.6)
MCH: 40 pg — AB (ref 26.0–34.0)
MCHC: 33.2 g/dL (ref 32.0–36.0)
MCV: 120 fL — AB (ref 80–100)
MONOS PCT: 5.3 %
Monocyte #: 0.6 x10 3/mm (ref 0.2–0.9)
NEUTROS ABS: 5.1 x10 3/mm (ref 1.4–6.5)
NEUTROS PCT: 44.4 %
Platelet: 326 x10 3/mm (ref 150–440)
RBC: 2.58 10*6/uL — ABNORMAL LOW (ref 3.80–5.20)
RDW: 22.5 % — ABNORMAL HIGH (ref 11.5–14.5)
WBC: 11.5 x10 3/mm — ABNORMAL HIGH (ref 3.6–11.0)

## 2013-05-31 ENCOUNTER — Ambulatory Visit: Payer: Self-pay | Admitting: Internal Medicine

## 2013-06-13 LAB — CBC CANCER CENTER
Basophil #: 0.1 x10 3/mm (ref 0.0–0.1)
Basophil %: 1.3 %
Eosinophil #: 1 x10 3/mm — ABNORMAL HIGH (ref 0.0–0.7)
Eosinophil %: 9.3 %
HCT: 33.2 % — ABNORMAL LOW (ref 35.0–47.0)
HGB: 11.2 g/dL — ABNORMAL LOW (ref 12.0–16.0)
LYMPHS PCT: 32.1 %
Lymphocyte #: 3.5 x10 3/mm (ref 1.0–3.6)
MCH: 40.2 pg — AB (ref 26.0–34.0)
MCHC: 33.9 g/dL (ref 32.0–36.0)
MCV: 119 fL — ABNORMAL HIGH (ref 80–100)
MONO ABS: 0.6 x10 3/mm (ref 0.2–0.9)
Monocyte %: 5.9 %
NEUTROS ABS: 5.6 x10 3/mm (ref 1.4–6.5)
Neutrophil %: 51.4 %
Platelet: 339 x10 3/mm (ref 150–440)
RBC: 2.8 10*6/uL — ABNORMAL LOW (ref 3.80–5.20)
RDW: 21.3 % — ABNORMAL HIGH (ref 11.5–14.5)
WBC: 10.9 x10 3/mm (ref 3.6–11.0)

## 2013-06-24 DIAGNOSIS — I10 Essential (primary) hypertension: Secondary | ICD-10-CM | POA: Insufficient documentation

## 2013-06-24 DIAGNOSIS — E559 Vitamin D deficiency, unspecified: Secondary | ICD-10-CM | POA: Insufficient documentation

## 2013-06-24 DIAGNOSIS — E785 Hyperlipidemia, unspecified: Secondary | ICD-10-CM | POA: Insufficient documentation

## 2013-06-24 DIAGNOSIS — D721 Eosinophilia: Secondary | ICD-10-CM

## 2013-06-24 DIAGNOSIS — J449 Chronic obstructive pulmonary disease, unspecified: Secondary | ICD-10-CM | POA: Insufficient documentation

## 2013-06-24 DIAGNOSIS — R898 Other abnormal findings in specimens from other organs, systems and tissues: Secondary | ICD-10-CM | POA: Insufficient documentation

## 2013-06-24 DIAGNOSIS — N183 Chronic kidney disease, stage 3 unspecified: Secondary | ICD-10-CM | POA: Insufficient documentation

## 2013-06-24 DIAGNOSIS — I2510 Atherosclerotic heart disease of native coronary artery without angina pectoris: Secondary | ICD-10-CM | POA: Insufficient documentation

## 2013-06-25 DIAGNOSIS — D649 Anemia, unspecified: Secondary | ICD-10-CM | POA: Insufficient documentation

## 2013-06-25 DIAGNOSIS — R252 Cramp and spasm: Secondary | ICD-10-CM | POA: Insufficient documentation

## 2013-06-28 LAB — CBC CANCER CENTER
BASOS ABS: 0.1 x10 3/mm (ref 0.0–0.1)
BASOS PCT: 1.2 %
Eosinophil #: 1.1 x10 3/mm — ABNORMAL HIGH (ref 0.0–0.7)
Eosinophil %: 11.8 %
HCT: 33.4 % — ABNORMAL LOW (ref 35.0–47.0)
HGB: 11.1 g/dL — AB (ref 12.0–16.0)
LYMPHS ABS: 2.1 x10 3/mm (ref 1.0–3.6)
Lymphocyte %: 23 %
MCH: 40.2 pg — ABNORMAL HIGH (ref 26.0–34.0)
MCHC: 33.4 g/dL (ref 32.0–36.0)
MCV: 120 fL — AB (ref 80–100)
Monocyte #: 0.5 x10 3/mm (ref 0.2–0.9)
Monocyte %: 6 %
NEUTROS ABS: 5.2 x10 3/mm (ref 1.4–6.5)
Neutrophil %: 58 %
Platelet: 252 x10 3/mm (ref 150–440)
RBC: 2.78 10*6/uL — AB (ref 3.80–5.20)
RDW: 18.6 % — ABNORMAL HIGH (ref 11.5–14.5)
WBC: 8.9 x10 3/mm (ref 3.6–11.0)

## 2013-07-01 ENCOUNTER — Ambulatory Visit: Payer: Self-pay | Admitting: Internal Medicine

## 2013-07-30 DIAGNOSIS — I4891 Unspecified atrial fibrillation: Secondary | ICD-10-CM | POA: Insufficient documentation

## 2013-08-05 ENCOUNTER — Inpatient Hospital Stay: Payer: Self-pay | Admitting: Student

## 2013-08-05 LAB — CBC
HCT: 25.8 % — AB (ref 35.0–47.0)
HGB: 8.5 g/dL — ABNORMAL LOW (ref 12.0–16.0)
MCH: 38.8 pg — ABNORMAL HIGH (ref 26.0–34.0)
MCHC: 32.7 g/dL (ref 32.0–36.0)
MCV: 119 fL — ABNORMAL HIGH (ref 80–100)
Platelet: 389 10*3/uL (ref 150–440)
RBC: 2.18 10*6/uL — ABNORMAL LOW (ref 3.80–5.20)
RDW: 15.7 % — AB (ref 11.5–14.5)
WBC: 13 10*3/uL — ABNORMAL HIGH (ref 3.6–11.0)

## 2013-08-05 LAB — LIPASE, BLOOD: LIPASE: 141 U/L (ref 73–393)

## 2013-08-05 LAB — COMPREHENSIVE METABOLIC PANEL
AST: 14 U/L — AB (ref 15–37)
Albumin: 3.1 g/dL — ABNORMAL LOW (ref 3.4–5.0)
Alkaline Phosphatase: 145 U/L — ABNORMAL HIGH
Anion Gap: 9 (ref 7–16)
BUN: 15 mg/dL (ref 7–18)
Bilirubin,Total: 0.6 mg/dL (ref 0.2–1.0)
CHLORIDE: 102 mmol/L (ref 98–107)
CREATININE: 1.16 mg/dL (ref 0.60–1.30)
Calcium, Total: 8.3 mg/dL — ABNORMAL LOW (ref 8.5–10.1)
Co2: 25 mmol/L (ref 21–32)
GFR CALC AF AMER: 53 — AB
GFR CALC NON AF AMER: 45 — AB
Glucose: 95 mg/dL (ref 65–99)
OSMOLALITY: 273 (ref 275–301)
Potassium: 3.3 mmol/L — ABNORMAL LOW (ref 3.5–5.1)
SGPT (ALT): 9 U/L — ABNORMAL LOW (ref 12–78)
Sodium: 136 mmol/L (ref 136–145)
Total Protein: 7.3 g/dL (ref 6.4–8.2)

## 2013-08-05 LAB — TROPONIN I

## 2013-08-06 LAB — CBC WITH DIFFERENTIAL/PLATELET
BASOS ABS: 0.1 10*3/uL (ref 0.0–0.1)
BASOS ABS: 0.1 10*3/uL (ref 0.0–0.1)
BASOS PCT: 0.6 %
BASOS PCT: 0.3 %
Basophil #: 0 10*3/uL (ref 0.0–0.1)
Basophil #: 0 10*3/uL (ref 0.0–0.1)
Basophil %: 0.5 %
Basophil %: 0.1 %
EOS ABS: 0 10*3/uL (ref 0.0–0.7)
EOS PCT: 5.1 %
EOS PCT: 0 %
Eosinophil #: 0.7 10*3/uL (ref 0.0–0.7)
Eosinophil #: 0.7 10*3/uL (ref 0.0–0.7)
Eosinophil #: 0 10*3/uL (ref 0.0–0.7)
Eosinophil %: 5.8 %
Eosinophil %: 0.1 %
HCT: 23 % — ABNORMAL LOW (ref 35.0–47.0)
HCT: 24.5 % — ABNORMAL LOW (ref 35.0–47.0)
HCT: 24.2 % — ABNORMAL LOW (ref 35.0–47.0)
HCT: 22.8 % — ABNORMAL LOW (ref 35.0–47.0)
HGB: 7.7 g/dL — ABNORMAL LOW (ref 12.0–16.0)
HGB: 7.9 g/dL — ABNORMAL LOW (ref 12.0–16.0)
HGB: 7.7 g/dL — ABNORMAL LOW (ref 12.0–16.0)
HGB: 7.4 g/dL — ABNORMAL LOW (ref 12.0–16.0)
LYMPHS ABS: 0.2 10*3/uL — AB (ref 1.0–3.6)
LYMPHS PCT: 15.6 %
LYMPHS PCT: 2.3 %
LYMPHS PCT: 4.2 %
Lymphocyte #: 1.9 10*3/uL (ref 1.0–3.6)
Lymphocyte #: 1.4 10*3/uL (ref 1.0–3.6)
Lymphocyte #: 0.4 10*3/uL — ABNORMAL LOW (ref 1.0–3.6)
Lymphocyte %: 9.9 %
MCH: 39.8 pg — ABNORMAL HIGH (ref 26.0–34.0)
MCH: 38.4 pg — AB (ref 26.0–34.0)
MCH: 38.3 pg — ABNORMAL HIGH (ref 26.0–34.0)
MCH: 38.5 pg — ABNORMAL HIGH (ref 26.0–34.0)
MCHC: 33.6 g/dL (ref 32.0–36.0)
MCHC: 32 g/dL (ref 32.0–36.0)
MCHC: 32 g/dL (ref 32.0–36.0)
MCHC: 32.3 g/dL (ref 32.0–36.0)
MCV: 118 fL — AB (ref 80–100)
MCV: 120 fL — AB (ref 80–100)
MCV: 120 fL — ABNORMAL HIGH (ref 80–100)
MCV: 119 fL — ABNORMAL HIGH (ref 80–100)
MONO ABS: 0.8 x10 3/mm (ref 0.2–0.9)
MONOS PCT: 0.8 %
Monocyte #: 0.6 x10 3/mm (ref 0.2–0.9)
Monocyte #: 0.1 x10 3/mm — ABNORMAL LOW (ref 0.2–0.9)
Monocyte #: 0.2 x10 3/mm (ref 0.2–0.9)
Monocyte %: 4.8 %
Monocyte %: 5.9 %
Monocyte %: 1.9 %
NEUTROS ABS: 9 10*3/uL — AB (ref 1.4–6.5)
NEUTROS ABS: 8.7 10*3/uL — AB (ref 1.4–6.5)
NEUTROS PCT: 73.2 %
NEUTROS PCT: 93.6 %
Neutrophil #: 10.9 10*3/uL — ABNORMAL HIGH (ref 1.4–6.5)
Neutrophil #: 10.3 10*3/uL — ABNORMAL HIGH (ref 1.4–6.5)
Neutrophil %: 78.6 %
Neutrophil %: 96.7 %
PLATELETS: 317 10*3/uL (ref 150–440)
Platelet: 349 10*3/uL (ref 150–440)
Platelet: 358 10*3/uL (ref 150–440)
Platelet: 326 10*3/uL (ref 150–440)
RBC: 1.95 10*6/uL — AB (ref 3.80–5.20)
RBC: 2.05 10*6/uL — AB (ref 3.80–5.20)
RBC: 2.02 10*6/uL — ABNORMAL LOW (ref 3.80–5.20)
RBC: 1.92 10*6/uL — ABNORMAL LOW (ref 3.80–5.20)
RDW: 15.8 % — AB (ref 11.5–14.5)
RDW: 15.9 % — AB (ref 11.5–14.5)
RDW: 15.8 % — AB (ref 11.5–14.5)
RDW: 16 % — AB (ref 11.5–14.5)
WBC: 12.3 10*3/uL — ABNORMAL HIGH (ref 3.6–11.0)
WBC: 13.8 10*3/uL — AB (ref 3.6–11.0)
WBC: 10.7 10*3/uL (ref 3.6–11.0)
WBC: 9.3 10*3/uL (ref 3.6–11.0)

## 2013-08-06 LAB — URINALYSIS, COMPLETE
BLOOD: NEGATIVE
Bilirubin,UR: NEGATIVE
Glucose,UR: NEGATIVE mg/dL (ref 0–75)
Leukocyte Esterase: NEGATIVE
Nitrite: NEGATIVE
PROTEIN: NEGATIVE
Ph: 5 (ref 4.5–8.0)
RBC,UR: 1 /HPF (ref 0–5)
Specific Gravity: 1.04 (ref 1.003–1.030)
Squamous Epithelial: 10
WBC UR: 1 /HPF (ref 0–5)

## 2013-08-07 LAB — CBC WITH DIFFERENTIAL/PLATELET
Bands: 1 %
HCT: 22.9 % — ABNORMAL LOW (ref 35.0–47.0)
HGB: 7.3 g/dL — ABNORMAL LOW (ref 12.0–16.0)
LYMPHS PCT: 13 %
MCH: 38.2 pg — ABNORMAL HIGH (ref 26.0–34.0)
MCHC: 32.1 g/dL (ref 32.0–36.0)
MCV: 119 fL — AB (ref 80–100)
MONOS PCT: 4 %
Metamyelocyte: 1 %
PLATELETS: 353 10*3/uL (ref 150–440)
RBC: 1.92 10*6/uL — AB (ref 3.80–5.20)
RDW: 16.2 % — ABNORMAL HIGH (ref 11.5–14.5)
Segmented Neutrophils: 81 %
WBC: 9.3 10*3/uL (ref 3.6–11.0)

## 2013-08-08 LAB — CBC WITH DIFFERENTIAL/PLATELET
Bands: 7 %
HCT: 26.2 % — AB (ref 35.0–47.0)
HGB: 8.6 g/dL — ABNORMAL LOW (ref 12.0–16.0)
Lymphocytes: 14 %
MCH: 36.5 pg — ABNORMAL HIGH (ref 26.0–34.0)
MCHC: 32.9 g/dL (ref 32.0–36.0)
MCV: 111 fL — AB (ref 80–100)
MONOS PCT: 2 %
Platelet: 361 10*3/uL (ref 150–440)
RBC: 2.36 10*6/uL — AB (ref 3.80–5.20)
RDW: 24.2 % — ABNORMAL HIGH (ref 11.5–14.5)
SEGMENTED NEUTROPHILS: 77 %
WBC: 12 10*3/uL — ABNORMAL HIGH (ref 3.6–11.0)

## 2013-08-09 LAB — CBC WITH DIFFERENTIAL/PLATELET
BASOS PCT: 0.1 %
Basophil #: 0 10*3/uL (ref 0.0–0.1)
EOS ABS: 0 10*3/uL (ref 0.0–0.7)
EOS PCT: 0 %
HCT: 28.1 % — AB (ref 35.0–47.0)
HGB: 9.3 g/dL — AB (ref 12.0–16.0)
LYMPHS PCT: 8 %
Lymphocyte #: 0.8 10*3/uL — ABNORMAL LOW (ref 1.0–3.6)
MCH: 37.9 pg — ABNORMAL HIGH (ref 26.0–34.0)
MCHC: 33 g/dL (ref 32.0–36.0)
MCV: 115 fL — ABNORMAL HIGH (ref 80–100)
MONO ABS: 0.5 x10 3/mm (ref 0.2–0.9)
Monocyte %: 4.8 %
NEUTROS ABS: 8.5 10*3/uL — AB (ref 1.4–6.5)
Neutrophil %: 87.1 %
Platelet: 369 10*3/uL (ref 150–440)
RBC: 2.45 10*6/uL — ABNORMAL LOW (ref 3.80–5.20)
RDW: 23.2 % — ABNORMAL HIGH (ref 11.5–14.5)
WBC: 9.7 10*3/uL (ref 3.6–11.0)

## 2013-10-01 HISTORY — PX: COLONOSCOPY: SHX174

## 2013-10-01 HISTORY — PX: ESOPHAGOGASTRODUODENOSCOPY: SHX1529

## 2013-10-04 ENCOUNTER — Ambulatory Visit: Payer: Self-pay | Admitting: Internal Medicine

## 2013-10-04 LAB — CBC CANCER CENTER
Basophil #: 0.1 x10 3/mm (ref 0.0–0.1)
Basophil %: 1.1 %
EOS PCT: 11.5 %
Eosinophil #: 1 x10 3/mm — ABNORMAL HIGH (ref 0.0–0.7)
HCT: 27.5 % — ABNORMAL LOW (ref 35.0–47.0)
HGB: 8.8 g/dL — AB (ref 12.0–16.0)
LYMPHS ABS: 3.2 x10 3/mm (ref 1.0–3.6)
Lymphocyte %: 35 %
MCH: 38.3 pg — ABNORMAL HIGH (ref 26.0–34.0)
MCHC: 32.1 g/dL (ref 32.0–36.0)
MCV: 119 fL — ABNORMAL HIGH (ref 80–100)
MONOS PCT: 7.1 %
Monocyte #: 0.6 x10 3/mm (ref 0.2–0.9)
NEUTROS PCT: 45.3 %
Neutrophil #: 4.1 x10 3/mm (ref 1.4–6.5)
Platelet: 430 x10 3/mm (ref 150–440)
RBC: 2.3 10*6/uL — ABNORMAL LOW (ref 3.80–5.20)
RDW: 21.8 % — AB (ref 11.5–14.5)
WBC: 9.1 x10 3/mm (ref 3.6–11.0)

## 2013-10-04 LAB — IRON AND TIBC
IRON: 103 ug/dL (ref 50–170)
Iron Bind.Cap.(Total): 263 ug/dL (ref 250–450)
Iron Saturation: 39 %
UNBOUND IRON-BIND. CAP.: 160 ug/dL

## 2013-10-04 LAB — FERRITIN: Ferritin (ARMC): 798 ng/mL — ABNORMAL HIGH (ref 8–388)

## 2013-10-10 ENCOUNTER — Ambulatory Visit: Payer: Self-pay | Admitting: Gastroenterology

## 2013-10-14 LAB — PATHOLOGY REPORT

## 2013-10-18 LAB — CBC CANCER CENTER
BASOS PCT: 1.6 %
Basophil #: 0.2 x10 3/mm — ABNORMAL HIGH (ref 0.0–0.1)
EOS PCT: 17.3 %
Eosinophil #: 1.8 x10 3/mm — ABNORMAL HIGH (ref 0.0–0.7)
HCT: 26.3 % — AB (ref 35.0–47.0)
HGB: 8.6 g/dL — AB (ref 12.0–16.0)
Lymphocyte #: 4 x10 3/mm — ABNORMAL HIGH (ref 1.0–3.6)
Lymphocyte %: 38.7 %
MCH: 39.4 pg — ABNORMAL HIGH (ref 26.0–34.0)
MCHC: 32.7 g/dL (ref 32.0–36.0)
MCV: 121 fL — ABNORMAL HIGH (ref 80–100)
MONO ABS: 0.6 x10 3/mm (ref 0.2–0.9)
Monocyte %: 5.6 %
Neutrophil #: 3.8 x10 3/mm (ref 1.4–6.5)
Neutrophil %: 36.8 %
PLATELETS: 373 x10 3/mm (ref 150–440)
RBC: 2.18 10*6/uL — ABNORMAL LOW (ref 3.80–5.20)
RDW: 19.7 % — AB (ref 11.5–14.5)
WBC: 10.2 x10 3/mm (ref 3.6–11.0)

## 2013-10-31 ENCOUNTER — Ambulatory Visit: Payer: Self-pay | Admitting: Internal Medicine

## 2013-11-01 LAB — CBC CANCER CENTER
Basophil #: 0.1 x10 3/mm (ref 0.0–0.1)
Basophil %: 1.1 %
EOS ABS: 1.2 x10 3/mm — AB (ref 0.0–0.7)
EOS PCT: 9.3 %
HCT: 27.3 % — ABNORMAL LOW (ref 35.0–47.0)
HGB: 8.5 g/dL — ABNORMAL LOW (ref 12.0–16.0)
LYMPHS ABS: 2 x10 3/mm (ref 1.0–3.6)
Lymphocyte %: 15.3 %
MCH: 38.3 pg — ABNORMAL HIGH (ref 26.0–34.0)
MCHC: 31.3 g/dL — ABNORMAL LOW (ref 32.0–36.0)
MCV: 122 fL — ABNORMAL HIGH (ref 80–100)
MONO ABS: 0.9 x10 3/mm (ref 0.2–0.9)
MONOS PCT: 6.8 %
Neutrophil #: 8.8 x10 3/mm — ABNORMAL HIGH (ref 1.4–6.5)
Neutrophil %: 67.5 %
Platelet: 362 x10 3/mm (ref 150–440)
RBC: 2.23 10*6/uL — ABNORMAL LOW (ref 3.80–5.20)
RDW: 16.7 % — ABNORMAL HIGH (ref 11.5–14.5)
WBC: 13 x10 3/mm — AB (ref 3.6–11.0)

## 2013-11-19 LAB — CBC CANCER CENTER
BASOS ABS: 0.2 x10 3/mm — AB (ref 0.0–0.1)
Basophil %: 1.6 %
Eosinophil #: 2.1 x10 3/mm — ABNORMAL HIGH (ref 0.0–0.7)
Eosinophil %: 17.2 %
HCT: 28.4 % — ABNORMAL LOW (ref 35.0–47.0)
HGB: 9.2 g/dL — AB (ref 12.0–16.0)
LYMPHS PCT: 29.3 %
Lymphocyte #: 3.7 x10 3/mm — ABNORMAL HIGH (ref 1.0–3.6)
MCH: 37.5 pg — ABNORMAL HIGH (ref 26.0–34.0)
MCHC: 32.2 g/dL (ref 32.0–36.0)
MCV: 117 fL — ABNORMAL HIGH (ref 80–100)
Monocyte #: 0.8 x10 3/mm (ref 0.2–0.9)
Monocyte %: 6.1 %
Neutrophil #: 5.7 x10 3/mm (ref 1.4–6.5)
Neutrophil %: 45.8 %
Platelet: 600 x10 3/mm — ABNORMAL HIGH (ref 150–440)
RBC: 2.44 10*6/uL — AB (ref 3.80–5.20)
RDW: 18.1 % — ABNORMAL HIGH (ref 11.5–14.5)
WBC: 12.5 x10 3/mm — ABNORMAL HIGH (ref 3.6–11.0)

## 2013-12-01 ENCOUNTER — Ambulatory Visit: Payer: Self-pay | Admitting: Internal Medicine

## 2013-12-01 DIAGNOSIS — Z8701 Personal history of pneumonia (recurrent): Secondary | ICD-10-CM

## 2013-12-01 HISTORY — DX: Personal history of pneumonia (recurrent): Z87.01

## 2013-12-09 ENCOUNTER — Ambulatory Visit: Payer: Self-pay | Admitting: Internal Medicine

## 2013-12-09 LAB — CBC CANCER CENTER
BASOS ABS: 0.2 x10 3/mm — AB (ref 0.0–0.1)
Basophil %: 1.9 %
Eosinophil #: 3 x10 3/mm — ABNORMAL HIGH (ref 0.0–0.7)
Eosinophil %: 27.1 %
HCT: 29.7 % — AB (ref 35.0–47.0)
HGB: 9.3 g/dL — ABNORMAL LOW (ref 12.0–16.0)
Lymphocyte #: 3.1 x10 3/mm (ref 1.0–3.6)
Lymphocyte %: 27.2 %
MCH: 36.9 pg — ABNORMAL HIGH (ref 26.0–34.0)
MCHC: 31.5 g/dL — ABNORMAL LOW (ref 32.0–36.0)
MCV: 117 fL — AB (ref 80–100)
MONO ABS: 0.7 x10 3/mm (ref 0.2–0.9)
MONOS PCT: 6.4 %
NEUTROS ABS: 4.2 x10 3/mm (ref 1.4–6.5)
Neutrophil %: 37.4 %
Platelet: 420 x10 3/mm (ref 150–440)
RBC: 2.53 10*6/uL — ABNORMAL LOW (ref 3.80–5.20)
RDW: 17.6 % — ABNORMAL HIGH (ref 11.5–14.5)
WBC: 11.3 x10 3/mm — ABNORMAL HIGH (ref 3.6–11.0)

## 2013-12-27 ENCOUNTER — Inpatient Hospital Stay: Payer: Self-pay | Admitting: Internal Medicine

## 2013-12-27 LAB — BASIC METABOLIC PANEL
Anion Gap: 8 (ref 7–16)
BUN: 18 mg/dL (ref 7–18)
CO2: 25 mmol/L (ref 21–32)
Calcium, Total: 8.9 mg/dL (ref 8.5–10.1)
Chloride: 100 mmol/L (ref 98–107)
Creatinine: 1.1 mg/dL (ref 0.60–1.30)
EGFR (African American): >60
EGFR (Non-African Amer.): 51 — ABNORMAL LOW
Glucose: 101 mg/dL — ABNORMAL HIGH (ref 65–99)
Osmolality: 268 (ref 275–301)
POTASSIUM: 4.1 mmol/L (ref 3.5–5.1)
Sodium: 133 mmol/L — ABNORMAL LOW (ref 136–145)

## 2013-12-27 LAB — CBC
HCT: 29 % — ABNORMAL LOW (ref 35.0–47.0)
HGB: 9.5 g/dL — AB (ref 12.0–16.0)
MCH: 36.9 pg — ABNORMAL HIGH (ref 26.0–34.0)
MCHC: 32.7 g/dL (ref 32.0–36.0)
MCV: 113 fL — ABNORMAL HIGH (ref 80–100)
PLATELETS: 335 10*3/uL (ref 150–440)
RBC: 2.57 10*6/uL — ABNORMAL LOW (ref 3.80–5.20)
RDW: 18.8 % — ABNORMAL HIGH (ref 11.5–14.5)
WBC: 22.6 10*3/uL — AB (ref 3.6–11.0)

## 2013-12-27 LAB — TROPONIN I: TROPONIN-I: 0.02 ng/mL

## 2013-12-28 LAB — CBC WITH DIFFERENTIAL/PLATELET
BASOS ABS: 0.1 10*3/uL (ref 0.0–0.1)
Basophil %: 0.3 %
EOS PCT: 0 %
Eosinophil #: 0 10*3/uL (ref 0.0–0.7)
HCT: 24.7 % — AB (ref 35.0–47.0)
HGB: 7.6 g/dL — AB (ref 12.0–16.0)
LYMPHS PCT: 4.7 %
Lymphocyte #: 0.9 10*3/uL — ABNORMAL LOW (ref 1.0–3.6)
MCH: 35.7 pg — ABNORMAL HIGH (ref 26.0–34.0)
MCHC: 30.9 g/dL — AB (ref 32.0–36.0)
MCV: 115 fL — AB (ref 80–100)
MONOS PCT: 6.5 %
Monocyte #: 1.3 x10 3/mm — ABNORMAL HIGH (ref 0.2–0.9)
NEUTROS ABS: 17.4 10*3/uL — AB (ref 1.4–6.5)
NEUTROS PCT: 88.5 %
Platelet: 260 10*3/uL (ref 150–440)
RBC: 2.14 10*6/uL — AB (ref 3.80–5.20)
RDW: 18.5 % — ABNORMAL HIGH (ref 11.5–14.5)
WBC: 19.7 10*3/uL — ABNORMAL HIGH (ref 3.6–11.0)

## 2013-12-28 LAB — BASIC METABOLIC PANEL
Anion Gap: 6 — ABNORMAL LOW (ref 7–16)
BUN: 18 mg/dL (ref 7–18)
CALCIUM: 8.4 mg/dL — AB (ref 8.5–10.1)
CHLORIDE: 104 mmol/L (ref 98–107)
CO2: 26 mmol/L (ref 21–32)
Creatinine: 1.17 mg/dL (ref 0.60–1.30)
EGFR (Non-African Amer.): 48 — ABNORMAL LOW
GFR CALC AF AMER: 58 — AB
Glucose: 110 mg/dL — ABNORMAL HIGH (ref 65–99)
Osmolality: 274 (ref 275–301)
Potassium: 4.1 mmol/L (ref 3.5–5.1)
Sodium: 136 mmol/L (ref 136–145)

## 2013-12-28 LAB — LACTATE DEHYDROGENASE: LDH: 145 U/L (ref 81–246)

## 2013-12-28 LAB — FERRITIN: FERRITIN (ARMC): 992 ng/mL — AB (ref 8–388)

## 2013-12-28 LAB — IRON AND TIBC
IRON BIND. CAP.(TOTAL): 176 ug/dL — AB (ref 250–450)
Iron Saturation: 7 %
Iron: 13 ug/dL — ABNORMAL LOW (ref 50–170)
UNBOUND IRON-BIND. CAP.: 163 ug/dL

## 2013-12-28 LAB — RETICULOCYTES
ABSOLUTE RETIC COUNT: 0.028 10*6/uL (ref 0.019–0.186)
RETICULOCYTE: 1.3 % (ref 0.4–3.1)

## 2013-12-28 LAB — FOLATE: Folic Acid: 9.5 ng/mL (ref 3.1–100.0)

## 2013-12-29 LAB — BASIC METABOLIC PANEL
Anion Gap: 8 (ref 7–16)
BUN: 19 mg/dL — ABNORMAL HIGH (ref 7–18)
CHLORIDE: 103 mmol/L (ref 98–107)
CO2: 25 mmol/L (ref 21–32)
Calcium, Total: 8.7 mg/dL (ref 8.5–10.1)
Creatinine: 1.14 mg/dL (ref 0.60–1.30)
GFR CALC AF AMER: 60 — AB
GFR CALC NON AF AMER: 49 — AB
Glucose: 101 mg/dL — ABNORMAL HIGH (ref 65–99)
OSMOLALITY: 274 (ref 275–301)
Potassium: 3.7 mmol/L (ref 3.5–5.1)
Sodium: 136 mmol/L (ref 136–145)

## 2013-12-29 LAB — CBC WITH DIFFERENTIAL/PLATELET
Basophil #: 0.1 10*3/uL (ref 0.0–0.1)
Basophil %: 0.7 %
EOS PCT: 0.5 %
Eosinophil #: 0.1 10*3/uL (ref 0.0–0.7)
HCT: 25.3 % — ABNORMAL LOW (ref 35.0–47.0)
HGB: 8.1 g/dL — ABNORMAL LOW (ref 12.0–16.0)
LYMPHS ABS: 1.1 10*3/uL (ref 1.0–3.6)
Lymphocyte %: 6.7 %
MCH: 36.1 pg — ABNORMAL HIGH (ref 26.0–34.0)
MCHC: 32 g/dL (ref 32.0–36.0)
MCV: 113 fL — AB (ref 80–100)
MONOS PCT: 6 %
Monocyte #: 1 x10 3/mm — ABNORMAL HIGH (ref 0.2–0.9)
NEUTROS PCT: 86.1 %
Neutrophil #: 13.7 10*3/uL — ABNORMAL HIGH (ref 1.4–6.5)
Platelet: 275 10*3/uL (ref 150–440)
RBC: 2.24 10*6/uL — AB (ref 3.80–5.20)
RDW: 18.1 % — ABNORMAL HIGH (ref 11.5–14.5)
WBC: 15.8 10*3/uL — ABNORMAL HIGH (ref 3.6–11.0)

## 2013-12-30 LAB — CBC WITH DIFFERENTIAL/PLATELET
Basophil #: 0.1 10*3/uL (ref 0.0–0.1)
Basophil %: 0.5 %
Eosinophil #: 0.2 10*3/uL (ref 0.0–0.7)
Eosinophil %: 1.8 %
HCT: 22.5 % — ABNORMAL LOW (ref 35.0–47.0)
HGB: 7.2 g/dL — ABNORMAL LOW (ref 12.0–16.0)
LYMPHS ABS: 1.2 10*3/uL (ref 1.0–3.6)
Lymphocyte %: 11 %
MCH: 36.1 pg — AB (ref 26.0–34.0)
MCHC: 32.1 g/dL (ref 32.0–36.0)
MCV: 112 fL — ABNORMAL HIGH (ref 80–100)
Monocyte #: 0.8 x10 3/mm (ref 0.2–0.9)
Monocyte %: 6.8 %
Neutrophil #: 9 10*3/uL — ABNORMAL HIGH (ref 1.4–6.5)
Neutrophil %: 79.9 %
Platelet: 279 10*3/uL (ref 150–440)
RBC: 2 10*6/uL — ABNORMAL LOW (ref 3.80–5.20)
RDW: 18.6 % — ABNORMAL HIGH (ref 11.5–14.5)
WBC: 11.2 10*3/uL — ABNORMAL HIGH (ref 3.6–11.0)

## 2013-12-30 LAB — BASIC METABOLIC PANEL
Anion Gap: 8 (ref 7–16)
BUN: 17 mg/dL (ref 7–18)
CHLORIDE: 103 mmol/L (ref 98–107)
Calcium, Total: 8.5 mg/dL (ref 8.5–10.1)
Co2: 26 mmol/L (ref 21–32)
Creatinine: 1.09 mg/dL (ref 0.60–1.30)
EGFR (Non-African Amer.): 52 — ABNORMAL LOW
Glucose: 99 mg/dL (ref 65–99)
OSMOLALITY: 275 (ref 275–301)
Potassium: 3.6 mmol/L (ref 3.5–5.1)
SODIUM: 137 mmol/L (ref 136–145)

## 2013-12-30 LAB — HEPATIC FUNCTION PANEL A (ARMC)
ALK PHOS: 179 U/L — AB
Albumin: 2.7 g/dL — ABNORMAL LOW (ref 3.4–5.0)
BILIRUBIN DIRECT: 0.2 mg/dL (ref 0.0–0.2)
BILIRUBIN TOTAL: 0.6 mg/dL (ref 0.2–1.0)
SGOT(AST): 20 U/L (ref 15–37)
SGPT (ALT): 15 U/L
Total Protein: 6.2 g/dL — ABNORMAL LOW (ref 6.4–8.2)

## 2013-12-30 LAB — LIPASE, BLOOD: Lipase: 81 U/L (ref 73–393)

## 2013-12-30 LAB — URINE IEP, RANDOM

## 2013-12-31 ENCOUNTER — Ambulatory Visit: Payer: Self-pay | Admitting: Internal Medicine

## 2013-12-31 LAB — CBC WITH DIFFERENTIAL/PLATELET
Basophil #: 0.1 10*3/uL (ref 0.0–0.1)
Basophil %: 0.7 %
EOS ABS: 1.4 10*3/uL — AB (ref 0.0–0.7)
EOS PCT: 12.4 %
HCT: 28 % — ABNORMAL LOW (ref 35.0–47.0)
HGB: 9.3 g/dL — ABNORMAL LOW (ref 12.0–16.0)
LYMPHS PCT: 17.9 %
Lymphocyte #: 2 10*3/uL (ref 1.0–3.6)
MCH: 35.4 pg — ABNORMAL HIGH (ref 26.0–34.0)
MCHC: 33.1 g/dL (ref 32.0–36.0)
MCV: 107 fL — AB (ref 80–100)
MONO ABS: 0.8 x10 3/mm (ref 0.2–0.9)
Monocyte %: 7.5 %
Neutrophil #: 6.7 10*3/uL — ABNORMAL HIGH (ref 1.4–6.5)
Neutrophil %: 61.5 %
Platelet: 337 10*3/uL (ref 150–440)
RBC: 2.61 10*6/uL — ABNORMAL LOW (ref 3.80–5.20)
RDW: 23 % — AB (ref 11.5–14.5)
WBC: 10.9 10*3/uL (ref 3.6–11.0)

## 2013-12-31 LAB — PROT IMMUNOELECTROPHORES(ARMC)

## 2014-01-01 ENCOUNTER — Ambulatory Visit: Payer: Self-pay | Admitting: Internal Medicine

## 2014-01-01 LAB — CULTURE, BLOOD (SINGLE)

## 2014-01-01 LAB — EXPECTORATED SPUTUM ASSESSMENT W REFEX TO RESP CULTURE

## 2014-01-06 LAB — CBC CANCER CENTER
BASOS PCT: 2.2 %
Basophil #: 0.2 x10 3/mm — ABNORMAL HIGH (ref 0.0–0.1)
EOS PCT: 14.3 %
Eosinophil #: 1.4 x10 3/mm — ABNORMAL HIGH (ref 0.0–0.7)
HCT: 31.2 % — ABNORMAL LOW (ref 35.0–47.0)
HGB: 10.1 g/dL — ABNORMAL LOW (ref 12.0–16.0)
Lymphocyte #: 2.7 x10 3/mm (ref 1.0–3.6)
Lymphocyte %: 26.3 %
MCH: 34.5 pg — ABNORMAL HIGH (ref 26.0–34.0)
MCHC: 32.3 g/dL (ref 32.0–36.0)
MCV: 107 fL — AB (ref 80–100)
Monocyte #: 0.7 x10 3/mm (ref 0.2–0.9)
Monocyte %: 7.3 %
Neutrophil #: 5 x10 3/mm (ref 1.4–6.5)
Neutrophil %: 49.9 %
Platelet: 436 x10 3/mm (ref 150–440)
RBC: 2.92 10*6/uL — ABNORMAL LOW (ref 3.80–5.20)
RDW: 21.5 % — AB (ref 11.5–14.5)
WBC: 10.1 x10 3/mm (ref 3.6–11.0)

## 2014-01-08 DIAGNOSIS — K2970 Gastritis, unspecified, without bleeding: Secondary | ICD-10-CM | POA: Insufficient documentation

## 2014-01-08 DIAGNOSIS — D472 Monoclonal gammopathy: Secondary | ICD-10-CM | POA: Insufficient documentation

## 2014-01-30 LAB — CBC CANCER CENTER
BASOS ABS: 0.1 x10 3/mm (ref 0.0–0.1)
Basophil %: 1.1 %
EOS PCT: 6 %
Eosinophil #: 0.6 x10 3/mm (ref 0.0–0.7)
HCT: 31.2 % — ABNORMAL LOW (ref 35.0–47.0)
HGB: 10.1 g/dL — ABNORMAL LOW (ref 12.0–16.0)
LYMPHS ABS: 2.2 x10 3/mm (ref 1.0–3.6)
Lymphocyte %: 20.8 %
MCH: 35.3 pg — AB (ref 26.0–34.0)
MCHC: 32.5 g/dL (ref 32.0–36.0)
MCV: 109 fL — ABNORMAL HIGH (ref 80–100)
Monocyte #: 0.8 x10 3/mm (ref 0.2–0.9)
Monocyte %: 7.4 %
NEUTROS ABS: 7 x10 3/mm — AB (ref 1.4–6.5)
Neutrophil %: 64.7 %
PLATELETS: 410 x10 3/mm (ref 150–440)
RBC: 2.87 10*6/uL — ABNORMAL LOW (ref 3.80–5.20)
RDW: 23 % — ABNORMAL HIGH (ref 11.5–14.5)
WBC: 10.8 x10 3/mm (ref 3.6–11.0)

## 2014-01-31 ENCOUNTER — Ambulatory Visit: Payer: Self-pay | Admitting: Internal Medicine

## 2014-02-02 LAB — KAPPA/LAMBDA FREE LIGHT CHAINS (ARMC)

## 2014-04-23 ENCOUNTER — Ambulatory Visit
Admit: 2014-04-23 | Disposition: A | Discharge status: Home / Self Care | Payer: Self-pay | Attending: Internal Medicine | Admitting: Internal Medicine

## 2014-04-25 LAB — CBC CANCER CENTER
Basophil #: 0.1 x10 3/mm (ref 0.0–0.1)
Basophil %: 1.3 %
Eosinophil #: 1.8 x10 3/mm — ABNORMAL HIGH (ref 0.0–0.7)
Eosinophil %: 18.8 %
HCT: 29.5 % — ABNORMAL LOW (ref 35.0–47.0)
HGB: 9.7 g/dL — ABNORMAL LOW (ref 12.0–16.0)
Lymphocyte #: 2.8 x10 3/mm (ref 1.0–3.6)
Lymphocyte %: 29.5 %
MCH: 36.3 pg — ABNORMAL HIGH (ref 26.0–34.0)
MCHC: 32.8 g/dL (ref 32.0–36.0)
MCV: 110 fL — ABNORMAL HIGH (ref 80–100)
Monocyte #: 0.6 x10 3/mm (ref 0.2–0.9)
Monocyte %: 6 %
Neutrophil #: 4.2 x10 3/mm (ref 1.4–6.5)
Neutrophil %: 44.4 %
Platelet: 398 x10 3/mm (ref 150–440)
RBC: 2.66 10*6/uL — ABNORMAL LOW (ref 3.80–5.20)
RDW: 18.9 % — ABNORMAL HIGH (ref 11.5–14.5)
WBC: 9.5 x10 3/mm (ref 3.6–11.0)

## 2014-04-25 LAB — RETICULOCYTES
Absolute Retic Count: 0.037 10*6/uL (ref 0.019–0.186)
Reticulocyte: 1.4 % (ref 0.4–3.1)

## 2014-05-02 ENCOUNTER — Ambulatory Visit
Admit: 2014-05-02 | Disposition: A | Discharge status: Home / Self Care | Payer: Self-pay | Attending: Internal Medicine | Admitting: Internal Medicine

## 2014-05-04 LAB — COMPREHENSIVE METABOLIC PANEL
ALK PHOS: 92 U/L
ALT: 10 U/L — AB
ANION GAP: 11 (ref 7–16)
Albumin: 4.8 g/dL
BILIRUBIN TOTAL: 1 mg/dL
BUN: 14 mg/dL
CO2: 26 mmol/L
Calcium, Total: 9.4 mg/dL
Chloride: 101 mmol/L
Creatinine: 1.02 mg/dL — ABNORMAL HIGH
EGFR (African American): >60
EGFR (Non-African Amer.): 53 — ABNORMAL LOW
Glucose: 124 mg/dL — ABNORMAL HIGH
POTASSIUM: 3.9 mmol/L
SGOT(AST): 16 U/L
Sodium: 138 mmol/L
TOTAL PROTEIN: 7.8 g/dL

## 2014-05-04 LAB — URINALYSIS, COMPLETE
Bilirubin,UR: NEGATIVE
Blood: NEGATIVE
Glucose,UR: NEGATIVE mg/dL (ref 0–75)
Leukocyte Esterase: NEGATIVE
NITRITE: NEGATIVE
PH: 7 (ref 4.5–8.0)
RBC,UR: <1 /HPF (ref 0–5)
SPECIFIC GRAVITY: 1.012 (ref 1.003–1.030)
WBC UR: 1 /HPF (ref 0–5)

## 2014-05-04 LAB — CBC WITH DIFFERENTIAL/PLATELET
BASOS ABS: 0.1 10*3/uL (ref 0.0–0.1)
Basophil %: 1 %
Eosinophil #: 0 10*3/uL (ref 0.0–0.7)
Eosinophil %: 0.1 %
HCT: 34 % — ABNORMAL LOW (ref 35.0–47.0)
HGB: 10.8 g/dL — ABNORMAL LOW (ref 12.0–16.0)
LYMPHS PCT: 7 %
Lymphocyte #: 1 10*3/uL (ref 1.0–3.6)
MCH: 35 pg — ABNORMAL HIGH (ref 26.0–34.0)
MCHC: 31.9 g/dL — ABNORMAL LOW (ref 32.0–36.0)
MCV: 110 fL — ABNORMAL HIGH (ref 80–100)
MONOS PCT: 3.1 %
Monocyte #: 0.4 x10 3/mm (ref 0.2–0.9)
NEUTROS ABS: 12.5 10*3/uL — AB (ref 1.4–6.5)
NEUTROS PCT: 88.8 %
Platelet: 377 10*3/uL (ref 150–440)
RBC: 3.09 10*6/uL — ABNORMAL LOW (ref 3.80–5.20)
RDW: 18.9 % — AB (ref 11.5–14.5)
WBC: 14.1 10*3/uL — ABNORMAL HIGH (ref 3.6–11.0)

## 2014-05-04 LAB — TROPONIN I: Troponin-I: 0.14 ng/mL — ABNORMAL HIGH

## 2014-05-04 LAB — LIPASE, BLOOD: LIPASE: 54 U/L — AB

## 2014-05-05 ENCOUNTER — Inpatient Hospital Stay
Admit: 2014-05-05 | Disposition: A | Discharge status: Home / Self Care | Payer: Self-pay | Attending: Internal Medicine | Admitting: Internal Medicine

## 2014-05-05 LAB — CBC WITH DIFFERENTIAL/PLATELET
BASOS ABS: 0.1 10*3/uL (ref 0.0–0.1)
BASOS PCT: 0.9 %
EOS ABS: 0.1 10*3/uL (ref 0.0–0.7)
Eosinophil %: 0.9 %
HCT: 28.8 % — AB (ref 35.0–47.0)
HGB: 9.2 g/dL — ABNORMAL LOW (ref 12.0–16.0)
Lymphocyte #: 2.9 10*3/uL (ref 1.0–3.6)
Lymphocyte %: 28.5 %
MCH: 35 pg — ABNORMAL HIGH (ref 26.0–34.0)
MCHC: 31.8 g/dL — AB (ref 32.0–36.0)
MCV: 110 fL — ABNORMAL HIGH (ref 80–100)
MONO ABS: 0.7 x10 3/mm (ref 0.2–0.9)
Monocyte %: 7 %
NEUTROS ABS: 6.3 10*3/uL (ref 1.4–6.5)
Neutrophil %: 62.7 %
Platelet: 337 10*3/uL (ref 150–440)
RBC: 2.62 10*6/uL — ABNORMAL LOW (ref 3.80–5.20)
RDW: 19.4 % — AB (ref 11.5–14.5)
WBC: 10.1 10*3/uL (ref 3.6–11.0)

## 2014-05-05 LAB — COMPREHENSIVE METABOLIC PANEL
AST: 13 U/L — AB
Albumin: 3.8 g/dL
Alkaline Phosphatase: 78 U/L
Anion Gap: 9 (ref 7–16)
BUN: 13 mg/dL
Bilirubin,Total: 0.9 mg/dL
CALCIUM: 8.5 mg/dL — AB
Chloride: 103 mmol/L
Co2: 26 mmol/L
Creatinine: 0.94 mg/dL
EGFR (Non-African Amer.): 58 — ABNORMAL LOW
Glucose: 95 mg/dL
POTASSIUM: 3.8 mmol/L
SGPT (ALT): 7 U/L — ABNORMAL LOW
SODIUM: 138 mmol/L
TOTAL PROTEIN: 6.2 g/dL — AB

## 2014-05-05 LAB — TROPONIN I
Troponin-I: 0.17 ng/mL — ABNORMAL HIGH
Troponin-I: 0.15 ng/mL — ABNORMAL HIGH

## 2014-05-05 LAB — PROTIME-INR
INR: 1.2
Prothrombin Time: 14.9 secs

## 2014-05-05 LAB — APTT: ACTIVATED PTT: 32.6 s (ref 23.6–35.9)

## 2014-05-06 ENCOUNTER — Ambulatory Visit
Admit: 2014-05-06 | Disposition: A | Discharge status: Home / Self Care | Payer: Self-pay | Attending: Internal Medicine | Admitting: Internal Medicine

## 2014-05-23 NOTE — Discharge Summary (Signed)
PATIENT NAME:  Kara Clark, ALVIAR MR#:  [id number] DATE OF BIRTH:  02/17/1934  DATE OF ADMISSION:  01/06/2013 DATE OF DISCHARGE:  01/17/2013.   PRIMARY CARE PHYSICIAN:  Dr. Raechel Ache  FINAL DIAGNOSES:  1.  Acute on chronic respiratory failure, now requiring 3 L of oxygen upon discharge.  2.  Clinical sepsis with pneumonia; strep pneumoniae growing out of the sputum culture.  3.  Acute systolic congestive heart failure.  4.  Acute myocardial infarctions.  5.  Constipation.  6.  A history of lung nodules.  7.  Eosinophilia history.  8  A history of gastrointestinal bleed in the past.  9.  Anemia.  10.  Weakness.  11.  Rapid atrial fibrillation, converted to sinus rhythm.   MEDICATIONS ON DISCHARGE: Include:  1.  Ranitidine 150 mg twice a day.  2.  Vitamin D3, 2000 international units daily.  3.  Advair Diskus 150/50, 1 inhalation twice a day.  4.  Magnesium oxide 400 mg 1 tablet daily.  5.  Carafate 1 gram twice a day.  6.  Spiriva 1 inhalation daily.  7.  Plavix 75 mg daily.  8.  Acetaminophen 2 tablets every 4 hours as needed for pain or temperature.  9.  Simvastatin 20 mg at bedtime.  10.  Hydroxyurea 50 mg 6 days a week, every day except for Sunday.  11.  Xanax 0.25 mg every 8 hours as needed for anxiety.  12.  Metoprolol tartrate 25 mg twice a day.  13.  Witch hazel topically every 1 to 2 hours as needed for perianal discomfort.  14.  Iron polysaccharide150 mg twice a day.  15.  MiraLAX 17 grams orally daily as needed for constipation.  16.  D[employer]'s Mouthwash, which is an antacid, nystatin, diphenhydramine mouthwash, 10 mL every 6 hours for 10 days  17.  DuoNeb nebulizer solution 3 mL 4 times a day as needed for shortness of breath.   OXYGEN:  3 L nasal cannula.   DIET:  Low-sodium diet, Ensure 3 times a day, regular consistency. Diet also can be soft diet.   ACTIVITY:  As tolerated.   REFERRAL:  Physical Therapy.   FOLLOW UP INSTRUCTIONS:  Followup 1 to 2 days with doctor at  rehab. Of note can give as needed, Lasix 20 mg daily.   HISTORY OF PRESENT ILLNESS:  The patient was admitted December 7 status post fall, pain in her rib cage, increased shortness of breath. She was admitted with clinical sepsis, tachycardia, tachypnea, temperature, white count, started on treatment for a community-acquired pneumonia with Rocephin and Zithromax and steroids were added.   CONSULTANTS DURING THE HOSPITAL COURSE:  Included Dr. Rexene Edison, Surgery. Dr. Nehemiah Massed, Cardiology, Dr. Ermalinda Memos, Palliative Care, and Physical Therapy, Dr. Candace Cruise of Gastroenterology.   LABORATORY AND RADIOLOGICAL DATA DURING THE HOSPITAL COURSE:  Included an EKG that showed sinus tachycardia, right bundle branch block, left posterior fascicular block. Blood cultures negative. Glucose 120, BUN 14, creatinine 0.99, sodium 136, potassium 4.8, chloride 103, CO2 24, calcium 8.8. Liver function tests normal range. White blood cell count 11.5, H and H 9.3 and 27.7, platelet count of 367. Chest x-ray showed a mild vascular congestion, mild cardiomegaly, mild basilar airspace disease. No rib was fracture seen. Venous pH 7.40. Lactic acid 1.1. Repeat chest x-ray showed COPD, fibrotic change. Urinalysis negative. Sputum culture grew out strep pneumoniae and light growth of pantoea species. Ferritin 254. Lipase 64, TIBC 211. Magnesium 2.2. The patient's hemoglobin dipped down to 7.5 and was  transfused a unit of packed red blood cells. KUB showed left lung base opacity reflecting acute infiltrate. CT scan of the abdomen and pelvis showed abnormal edema in the porta hepatis, gallbladder wall thickening, suspected small gallstone, small bilateral pleural effusions, cardiomegaly, mobile cecum, scattered diverticulosis. Ultrasound of the abdomen showed layering echogenic foci in the gallbladder consistent with stones or sludge. No evidence for acute cholecystitis. Troponin on the 10th went up to 3.2, on the 11th it trended down to 2.7. LDL 61,  HDL 26, triglycerides 153. Echocardiogram on the 11th showed an EF of 45% to 50%. ABG on the 11th  showed a pH of 7.4, pCO2 of 38, pO2 of 118, bicarb 23.5, O2 saturation 97.2. Repeat chest x-ray on the 11th showed interstitial infiltrate, likely representing pulmonary edema. EKG on the 11th showed rapid atrial fibrillation. A CT angio for PE:  No PE, bilateral pleural effusions, right lower lobe consolidation, left lower lobe atelectasis or infiltrate. Creatinine on the 17th 0.89, hemoglobin 10.2. Occult blood negative. Last white count on the 14th 9.6, normal range.   HOSPITAL COURSE PER PROBLEM LIST:  1.  For the patient's acute on chronic respiratory failure. The patient was on high-flow nasal cannula during most of the hospital stay, was able to taper off the Ventimask and now to 3 L. Likely this is chronic and will need oxygen long term.  2.  Sepsis with pneumonia on admission. The patient was started on Rocephin and Zithromax, finished the entire hospital course of antibiotics while here. Also was on IV Solu-Medrol and was finished the prednisone taper while here. The patient is clinically improved. Strep pneumoniae grew out of the sputum culture, and I think this was the culprit of the patient's pneumonia.  3.  Acute systolic congestive heart failure. The patient has intermittently gotten Lasix while here. EF slightly low. Metoprolol is prescribed. I do recommend intermittent Lasix rather than standing dose Lasix because I think the patient is high risk for dehydration.  4.  Acute myocardial infarction. The patient has an ASPIRIN ALLERGY, on beta blocker already. Plavix limited with medications on this patient. The patient did not any aggressive management, Cardiology treated medically. No chest pain upon discharge.  5.  Constipation. The patient had a period of time where she did not have the bowel movements and then we needed to give lactulose and MiraLAX. Now the patient is moving her bowels. We  will give MiraLAX p.r.n.  6.  A history of lung nodules. Follows with Dr. Inez Pilgrim as outpatient. No further workup as per patient.  7.  Eosinophilia history. Follows with Dr. Inez Pilgrim as outpatient on hydroxyurea.  8.  A history of GI bleed in the past. The patient was guaiac-negative here, required a unit of packed red blood cells while here during the hospital stay. Continue Carafate and ranitidine.  9.  Anemia. Did require a unit of packed red blood cells while here for a hemoglobin of 7.5. Current hemoglobin 10.2. Continue iron supplementation.  10.  Weakness. The patient finally agreed to rehab. The patient will be transferred to rehab today.  11.  Rapid atrial fibrillation, needed ICU stay for a Cardizem drip, converted over to metoprolol and Cardizem. The patient became bradycardiac so Cardizem was stopped. The patient will continue on metoprolol 25 mg b.i.d.   The patient had a prolonged hospital course with multiple issues.   CODE STATUS:  The patient is a DO NOT RESUSCITATE. The patient did not want further workup but will need  rehab in order to get stronger prior to going home. The patient is a high risk patient for a readmission and death but the patient has been clinically stable now for a few days and vital signs are stable, clinically ready for discharge out to rehab for strength and gait training.   TIME SPENT ON DISCHARGE:  40 minutes.  ____________________________ Tana Conch. Leslye Peer, MD rjw:jm D: 01/17/2013 10:55:40 ET T: 01/17/2013 11:12:52 ET JOB#: [id number]  cc: Tana Conch. Leslye Peer, MD, <Dictator> Christena Flake. Raechel Ache, [place] MD ELECTRONICALLY SIGNED 01/18/2013 17:01

## 2014-05-23 NOTE — Consult Note (Signed)
PATIENT NAME:  Kara Clark, Kara Clark MR#:  [id number] DATE OF BIRTH:  01-03-1935  DATE OF CONSULTATION:  01/13/2013  REFERRING PHYSICIAN:   CONSULTING PHYSICIAN:  Lupita Dawn. Nefertiti Mohamad, MD  REASON FOR REFERRAL: Constipation   HISTORY OF PRESENT ILLNESS: The patient is a 80 year old white female who presented with increasing shortness of breath after she fell and hurt her rib cage. She does have a history of COPD and history of cervical cancer as well as coronary artery disease. She also has a lung mass that has not been evaluated fully yet. She started hurting in her right rib cage, her breathing got worse, and as a result she got admitted. She was hypoxic. Chest x-ray initially suggested possible infiltrate. The reason I got consulted was because of constipation issue. Initially she was not responding to the laxatives. The order for the consult was actually placed on December 12th but I did not get called under December 14th. Fortunately by this time her bowel movements have been improved. She has been on a combination of Colace and Senokot since admission. She is now starting to have bowel movements and she no longer has any abdominal pain or constipation issues.   REVIEW OF SYSTEMS: Please refer to the initial review of systems that was dictated when she was admitted on December 7th. She is not providing much history right now. She is rather sleepy and does not want to talk much. There is really no changes, although her breathing has gotten somewhat better. There are really no GI complaints other than she was constipated when she came in.   PAST MEDICAL HISTORY: Notable for coronary artery disease, COPD, lung mass, history of cervical cancer, and hyperlipidemia.   ALLERGIES: ASPIRIN.  PAST SURGICAL HISTORY: She has had an oophorectomy, cataract surgery, angioplasty, and abdominal hysterectomy.   FAMILY HISTORY: Notable for lung cancer, stroke, and heart disease.   SOCIAL HISTORY: She still smokes. She usually  smokes 2 packs a day and is not ready to quit. She denies alcohol use.   HOME MEDICATIONS: Include inhalers, hydroxyurea, Zantac, magnesium, and Carafate.   PHYSICAL EXAMINATION: GENERAL: She is on oxygen. She is in no acute distress right now.  LUNGS: Sounds show decreased breath sounds.  HEAD AND NECK: Within normal limits.  CARDIAC: Regular rhythm and rate.  ABDOMEN: Soft and nontender. There is no hepatomegaly. She has active bowel sounds.  EXTREMITIES: No clubbing, cyanosis, or edema.   LABORATORY DATA: Most recent labs from December 13 show a sodium of 138, potassium 4.4, chloride 104, CO2 29, BUN 35, creatinine 0.105, and glucose 124. Phosphorus was 2.8. White count 9, hemoglobin 8.1, and hematocrit 24.5.   ASSESSMENT AND RECOMMENDATIONS: This is a patient who came in with constipation, who has done better with combination of Colace and Senokot. The patient is on several medications including pain medication, which can cause worsening constipation. Since she is doing better we do not need to do any further work-up at this time. Continue with the stool laxatives. She may benefit from high fiber diet. I will sign off at this point, but call us back if there are gastrointestinal problems or gastrointestinal problems arise in the future. Thank you for the referral.  ____________________________ Lupita Dawn. Candace Cruise, MD pyo:sb D: 01/14/2013 08:27:24 ET T: 01/14/2013 08:52:19 ET JOB#: [id number]  cc: Lupita Dawn. Candace Cruise, MD, <Dictator> Lupita Dawn Felicity Penix MD ELECTRONICALLY SIGNED 01/16/2013 9:09

## 2014-05-23 NOTE — H&P (Signed)
PATIENT NAME:  Kara Clark, FOLZ MR#:  [id number] DATE OF BIRTH:  1934/06/01  DATE OF ADMISSION:  01/06/2013  REASON FOR ADMISSION: Status post fall. Pain in her rib cage, increased shortness of breath and sepsis.   PRIMARY CARE PHYSICIAN: Dr. (  Dr. Inez Pilgrim is her oncologist.   Dr. Raul Del her pulmonologist.  HISTORY OF PRESENT ILLNESS: This is a very nice 80 year old patient, who has history of chronic obstructive pulmonary disease, current smoker, hyperlipidemia, previous cervical cancer, coronary artery disease, previous GI bleeding, a lung mass that has been studied, but the patient does not want to have a bronchoscopy or further studies as per Dr. Marylene Land notes. The patient came in today with a history of falling Thanksgiving day out of her bed. It is starting to hurt a lot on her right rib cage, getting worse in the middle of the night last night, for which she is starting to breathe a little bit faster and having more difficulty with it. Apparently, the patient has chronic cough, is normal for her. She says that it is slightly increased and she has more sputum than usual. Her sputum is yellow and green. She says she has been having chills and today at the ER she had a fever of 101. She does not recall having fever prior to that, or at least she has not been documented. The patient is evaluated by the ER physician who determined that she may have pneumonia and septic syndrome and asked [place] to evaluate her for admission.   On my conversation the patient does not want to stay over here but she is hypoxic. She is breathing up to 34 and I convinced her that that would be the best thing to do. The patient has not been in the hospital lately. She does have lung nodule and chronic obstructive pulmonary disease, but no other risk factors for healthcare acquired pneumonia. She is more community acquired.   The patient has a chest x-ray that showed possible atelectasis versus infiltrate and this is likely  secondary to the pain on the rib cage, not taking deep breaths.   The patient is admitted for following on her septic syndrome. The patient also has, again, pain in her rib cage but she is not able to explain. She says the pain is not there right now, it only happens when she moves and it could get as intense of 8/10 without radiation, exacerbated by deep breathing or movement or cough.   REVIEW OF SYSTEMS:  Twelve system review of systems. CONSTITUTIONAL: No fever prior to today, but possible chills. Positive fatigue and weakness, status post fall.  EYES: No blurry vision, double vision.  EARS, NOSE, THROAT: No difficulty swallowing. No tinnitus.  RESPIRATORY: Positive cough; chronic, positive sputum; chronic which has turned yellow-green lately in the past 24 to 48 hours. She denies any hemoptysis. Positive dyspnea. No asthma. Positive chronic obstructive pulmonary disease. She is a smoker. Smoking cessation counseling given for over five minutes. The patient stated that she is not ready to quit. She has been smoking for too long. She is not proud of it, but she does not feel like she has the means to quit. She has even tried Chantix in the past.  CARDIOVASCULAR: No significant chest pain, other than the one mentioned on the right rib cage. No orthopnea, edema or palpitations.  GASTROINTESTINAL: No nausea, vomiting, abdominal pain, constipation, or diarrhea. She had a loose bowel movement yesterday.  GASTROINTESTINAL: No dysuria, hematuria, or changes in  frequency. Gynecologic: No breast masses. Positive history of cervical cancer in the past year no dysuria three.  ENDOCRINE: No polyuria, polydipsia, or polyphagia, cold or heat intolerance. GYNECOLOGIC: No breast masses, positive history of cervical cancer in the past. HEMATOLOGIC AND LYMPHATIC: The patient has chronic eosinophilia failure for which she takes hydroxyurea. No easy bruising or bleeding.  SKIN: No rashes or petechiae.   MUSCULOSKELETAL: No significant neck pain, back pain or swelling in the joints.  NEUROLOGIC: No numbness, tingling, CVAs or transient ischemic attacks.  PSYCHIATRIC: No significant depression or insomnia.   PAST MEDICAL HISTORY: 1. Hyperlipidemia.  2. Cervical cancer.  3. Coronary artery disease.  4. Chronic obstructive pulmonary disease.  5. Gastrointestinal bleeding.  6. Lung mass.  7. The patient is a current smoker.   PAST SURGICAL HISTORY: Right oophorectomy in 1968, cataract surgery 2009. Atherectomy with percutaneous transluminal angioplasty in 2001 and total abdominal hysterectomy in 1968.   ALLERGIES: ASPIRIN.   FAMILY HISTORY: Positive for lung cancer in several members of her family, who were smokers, also emphysema. The patient states multiple members in her family had CVAs and MIs. Her father and mother died from a myocardial infarction, both of them.   SOCIAL HISTORY: The patient smokes 2 packs a day for over 60 years. She is not ready to quit smoking. She has tried multiple occasions. Alcohol is negative. Drugs are negative. The patient lives by herself. She mobilizes with a walker. She is divorced.   CURRENT MEDICATIONS: Hydroxyurea 500 mg once a day for six days a week, Plavix 75 mg once a day, vitamin D 2000 units once daily. ProAir inhaler 2 puffs every four hours as needed for shortness of breath. Advair Diskus 100/50 twice daily, Tylenol as needed for pain, ranitidine 150 mg twice daily magnesium oxide 400 mg in the morning, sucralfate 1 gram once a day.   PHYSICAL EXAMINATION: VITAL SIGNS: Blood pressure 152/61, pulse 115, respirations 34, temperature 101. Oxygen saturation down to 93% on 3 liters nasal cannula. There is no documented oxygen saturation on room air, but I was told by the ER physician that the patient was hypoxic in the 80s at some point.  GENERAL: The patient is lethargic. No acute distress. Mild respiratory distress. Whenever she talks there is a  slight use of accessory muscles. The patient looks like she is in pain,  although she denies any pain. HEENT:  Her pupils are equal and reactive. Extraocular movements are intact. Mucosae are dry. Anicteric sclerae. Pink conjunctivae. No oral lesions. No oropharyngeal exudates.  NECK: Supple. No JVD. No thyromegaly. No adenopathy. No carotid bruits.  CARDIOVASCULAR: Regular rate and rhythm, tachycardic. No murmurs, rubs or gallops. No displacement of PMI. Tender to palpation at the level of the right rib cage but not on anterior chest or sternal area.  LUNGS: Showing significant rhonchi, diffuse over her both lung fields and there is slight wheezing on expiratory phase. She has no tubular sounds and there is rales in middle lobes. She uses respiratory accessory muscles whenever she talks or tries to move.   ABDOMEN: Soft, nontender, nondistended. No hepatosplenomegaly. No masses. Bowel sounds are positive.  GENITAL: Deferred.  EXTREMITIES: No edema, cyanosis or clubbing.  VASCULAR: Pulses +2. Capillary refill less than 3.  SKIN: No rashes, petechiae, there is positive decreased turgor. No edema of the skin.  PSYCHIATRIC: The patient looks uncomfortable, flat affect, but no significant agitation.  NEUROLOGIC: Cranial nerves II through XII intact. The patient follows commands very well.  Strength is 5/5 in all four extremities.  MUSCULOSKELETAL: No joint effusions or joint swelling.  LYMPHATIC: Negative for lymphadenopathy in the neck or supraclavicular areas.   LABORATORY, DIAGNOSTIC AND RADIOLOGICAL DATA: Chest x-ray had mild vascular congestion and mild cardiomegaly, mild left bacillary airspace opacity that could be atelectasis versus infiltrate. No fractures.   Her pH is 7.4, pCO2 is 44. Lactic acid is 1. White count is 11.5, hemoglobin 9.3, platelet count 367. Troponin is 0.02. LFTs within normal limits. Glucose 120, creatinine 0.99. Other electrolytes within normal limits. Urinalysis has not  been done. We are sending urinalysis and culture today.   ASSESSMENT AND PLAN: This is a very nice 80 year old female admitted with systemic inflammatory response syndrome, sepsis, possibly due to beginnings  of pneumonia.  1. Systemic inflammatory response syndrome. The patient meets criteria for systemic inflammatory response syndrome with tachycardia, tachypnea and temperature elevation, white blood count elevation. Likely this is secondary to respiratory process pneumonia versus chronic obstructive pulmonary disease exacerbation. At this moment, the patient is going to be treated for community-acquired pneumonia. Her chest x-ray is not necessarily clear that she has new consolidated, but this could be the beginnings of it. We are going to treat her with Rocephin and azithromycin. Blood cultures have been sent. Sputum cultures has been ordered. Pulmonary toilet, nebulizers treatment to be continued.  2. Dyspnea. The patient had oxygen saturation of 93%. There is no documented oxygen saturation on room air. We are going to continue to treat with p.r.n. oxygen and do a clearance of her upper respiratory with pulmonary toilet.  Likely, this is secondary to beginnings of pneumonia on top of chronic obstructive pulmonary disease exacerbation.  3. Chronic obstructive pulmonary disease. The patient has an exacerbation, has some rhonchi and some wheezing. She always has cough. She always has sputum, although her sputum has changed colors significantly, for what we are going to treat her with antibiotics for community-acquired pneumonia and add the steroids, start IV and then change to p.o. when the patient more stable.  4. History of lung masses, lung nodule. The patient as per Dr. Marylene Land notes has refused to do any further studies. She says that if it is cancer, it is cancer and what she is going to do is probably not treat it. For which we are not going to pursue this diagnosis.  5. Eosinophilia continue  hydroxyurea.  6. Coronary artery disease. The patient cannot take aspirin due to allergy. Continue Plavix. No active symptoms. The patient is not on a beta blocker and not on any other blood pressure medications. Beta blocker likely is due to her past chronic obstructive pulmonary disease for what we are not going to start one.  7. History of gastrointestinal bleeding. Continue gastrointestinal prophylaxis with her home medications, ranitidine.    8. Deep vein thrombosis prophylaxis with Lovenox.   TIME SPENT: I spent about 45 minutes with this patient.     ____________________________ [place] Sink, MD rsg:sg D: 01/06/2013 07:43:41 ET T: 01/06/2013 08:36:53 ET JOB#: [id number]  cc: Stanton Sink, MD, <Dictator> [place] MD ELECTRONICALLY SIGNED 01/10/2013 18:18

## 2014-05-23 NOTE — Consult Note (Signed)
PATIENT NAME:  Kara Clark, APPERSON MR#:  [id number] DATE OF BIRTH:  [birth year]  DATE OF CONSULTATION:  01/09/2013  REFERRING PHYSICIAN:   CONSULTING PHYSICIAN:  Harrell Gave A. Krystal Teachey, MD  REASON FOR CONSULTATION: Kara Clark is a pleasant 80 year old with history of COPD, tobacco use, hyperlipidemia, history of cervical cancer, coronary artery disease, GI bleed and lung mass. She was admitted on 12/07 following a fall on Thanksgiving. She said that it hurt initially in her right rib cage and was related to having breathing difficulties. She had a fever of 101 at the time. She also had some nausea and vomiting during her hospital course. Her pain has resolved as her breathing has increased. She currently has had a CT scan, which is relatively unremarkable and a right upper quadrant ultrasound, which was negative for cholecystitis and even if I thought surgery would benefit her, she is not interested in any surgery. No current fevers, chills, night sweats, shortness of breath, cough, chest pain, nausea, vomiting, diarrhea, constipation, dysuria, or hematuria.   PAST MEDICAL HISTORY: Hyperlipidemia, cervical cancer, lung mass, coronary artery disease, COPD, GI bleed, history of right oophorectomy, history of cataract surgery, history of atherectomy with percutaneous transluminal angioplasty in 2001 and total abdominal hysterectomy in 1968.   HOME MEDICATIONS: Hydroxyurea, Plavix, vitamin D, Pro-air inhaler, Advair, Tylenol p.r.n., Ranitidine, magnesium oxide and Carafate.   ALLERGIES: ASPIRIN.   SOCIAL HISTORY: She smokes 2 packs a day. Denies alcohol. Denies drugs.   FAMILY HISTORY: Positive for lung cancer, emphysema and history of CVAs and MIs.   PHYSICAL EXAMINATION:  VITAL SIGNS: Temperature 98.6, pulse 109, blood pressure 107/69, respirations 20, 95% on 2 L.  GENERAL: No acute distress. Alert and oriented x 3. HEAD: Normocephalic, atraumatic. Eyes: No scleral icterus. No conjunctivitis.  Face: No obvious facial trauma. Normal external nose. Normal external ears. CHEST: Lungs clear to auscultation. Moving air well.  HEART: Regular rate and rhythm. No murmurs, rubs, or gallops.  ABDOMEN: Soft, nontender, nondistended.  EXTREMITIES: Moves all extremities well. Strength 5 out of 5.  NEUROLOGIC: Cranial nerves II through XII grossly intact. Sensation intact in all 4 extremities.   LABORATORY DATA: Significant for a white cell count of 14.3, hemoglobin 7.6, platelets of 398. Creatinine is 1.03, potassium currently is 3.4.   CT scan showed possible pericholecystic fluid; however, ultrasound showed a dependent stone versus sludge. No pericholecystic fluid. Normal gallbladder wall thickness.   ASSESSMENT AND PLAN: Kara Clark is a pleasant 80 year old, who initially had abdominal pain, likely related to trauma that also affected her breathing, which caused her to come in the hospital. She is currently not having any pain and imaging is unremarkable. No acute surgery issues, in fact, when I mentioned surgery as I entered the room she seemed adamantly opposed to it. Unsure of etiology of leukocytosis.  ____________________________ Glena Norfolk Ayanni Tun, MD cal:aw D: 01/09/2013 09:31:51 ET T: 01/09/2013 09:47:04 ET JOB#: [id number]  cc: Harrell Gave A. Cosandra Plouffe, MD, <Dictator> Floyde Parkins MD ELECTRONICALLY SIGNED 01/10/2013 11:44

## 2014-05-23 NOTE — Consult Note (Signed)
PATIENT NAME:  Kara Clark, Kara Clark MR#:  [id number] DATE OF BIRTH:  Sep 20, 1934  DATE OF CONSULTATION:  01/09/2013  REFERRING PHYSICIAN:  Dr. Bridgette Habermann  CONSULTING PHYSICIAN:  Corey Skains, MD  REASON FOR CONSULTATION: Subendocardial myocardial infarction, coronary artery disease, old myocardial infarction, anemia, needing further treatment options.   CHIEF COMPLAINT: The patient is weak and had chest pain.   HISTORY OF PRESENT ILLNESS: This is a 80 year old female with known coronary disease status post previous myocardial infarction who has had acute issues with weakness and fatigue.  She was admitted for anemia and other concerns and had a new onset of chest discomfort late last night into the morning with resolution of that chest pain at this time. The patient had an EKG at that time showing normal sinus rhythm, left axis deviation with bifascicular block. Since then, the patient has had further evaluation of the possibility of myocardial infarction, with a troponin elevation of 3.1, consistent with non-ST elevation myocardial infarction and acute coronary syndrome. The patient has had appropriate treatment with this including Plavix, which has also been in place due to previous stent placement in the past. The patient has had oxygenation to help, but has not had any heparin at this time due to significant anemia and low hemoglobin at 7.0. Otherwise patient will probably need further intervention.   REVIEW OF SYSTEMS:  Not able to assess due to obtundation.   PAST MEDICAL HISTORY:  1.  Old myocardial infarction.  2.  Coronary artery disease with stenting.  3.  Hypertension.   FAMILY HISTORY: No family members with early onset of cardiovascular disease or hypertension.   SOCIAL HISTORY: Currently denies alcohol or tobacco use.   ALLERGIES: As listed.   MEDICATIONS: As listed.   PHYSICAL EXAMINATION: VITAL SIGNS: Blood pressure 100/60 bilaterally, heart rate is 70 upright, reclining, and  regular.  GENERAL: She is an ill-appearing elderly female in no acute distress.  HEENT: No icterus, thyromegaly, ulcers, hemorrhage, or xanthelasma.  CARDIOVASCULAR: Regular rate and rhythm. Normal S1 and S2. There is a 2/6 apical murmur consistent mitral regurgitation. PMI is diffuse.  Carotid upstroke normal without bruit. Jugular venous pressure is normal.  LUNGS: Have bibasilar crackles with normal respirations.  ABDOMEN:  Soft with diffuse tenderness.  EXTREMITIES:  2+ radial, femoral, trace dorsal pedal pulses with trace lower extremity edema. No cyanosis, clubbing or ulcers.  NEUROLOGIC: She is not oriented to time, place or person.   ASSESSMENT: This is a 80 year old female with coronary artery disease, hypertension, hyperlipidemia with significant new onset of anemia and subendocardial infarction, elevated troponin and acute coronary syndrome, now more stable.   RECOMMENDATIONS: 1.  Plavix for further risk reduction in acute coronary syndrome.  2.  No use of heparin at this time due to resolution of chest discomfort, but would consider nitrates for further improvements.  3.  Serial electrocardiogram and enzymes to assess extent of myocardial infarction.  4.  Consider echocardiogram for extent of left ventricular systolic dysfunction and myocardial infarction.  5.  Packed red blood cells for significant anemia and anginal symptoms.  6.  Further diagnostic testing and treatment options after above.    ____________________________ Corey Skains, MD bjk:cc D: 01/09/2013 17:43:06 ET T: 01/09/2013 19:28:54 ET JOB#: [id number]  cc: Corey Skains, MD, <Dictator> Corey Skains MD ELECTRONICALLY SIGNED 01/10/2013 16:54

## 2014-05-23 NOTE — Consult Note (Signed)
Pt seen and examined. Full consult to follow. Consult ordered on 12/12 but not called to me until this AM. Admitted with resp failure. Had constipation. ON multiple meds, incl pain meds, which can contribute constipation. Fortunately, constipation better with colace and senekot prn. Last BM yest. Abdomen nondistended and nontender. Sleepy. On O2. Recommend high fiber diet. Since patient is having BM's, will sign off. Call us back if GI questions arise. Thanks.  Electronic Signatures: Verdie Shire (MD)  (Signed on 14-Dec-14 11:03)  Authored  Last Updated: 14-Dec-14 11:03 by Verdie Shire (MD)

## 2014-05-24 NOTE — Consult Note (Signed)
Note Type Consult   Subjective: Chief Complaint/Diagnosis:   Acute on chronic anemia. Pulmonary nodule. HPI:   Patient is a 80 year old female who presented to the hospital with worsening shortness of breath and cough. Subsequent chest x-ray revealed possible left lower lobe pneumonia. She also was noted to have a worsening anemia. Currently, patient feels well. She has no neurologic complaints. She denies any weight loss. She denies any recent fevers. She continues to have shortness of breath, but denies any chest pain. She denies any nausea, vomiting, constipation, or diarrhea. She denies any recent bleeding. Patient otherwise feels well and offers no further specific complaints.   Review of Systems:  Performance Status (ECOG): 2  Review of Systems:   As per HPI. Otherwise, 10 point system review was negative.   Allergies:  Aspirin: Other  Lidoderm: Rash  PFSH: Additional Past Medical and Surgical History: COPD, eosinophilia, pulmonary nodule, cervical cancer unknown stage, CAD.    Family history: CAD, CVA.    Social history: Positive tobacco, greater than 60 pack years. Denies alcohol.   Home Medications: Medication Instructions Last Modified Date/Time  Vitamin D3 2000 intl units oral tablet 1 tab(s) orally once a day 27-Nov-15 13:40  Spiriva 18 mcg inhalation capsule 1 cap(s) inhaled once a day 27-Nov-15 13:40  acetaminophen 325 mg oral tablet 2 tab(s) orally every 4 hours, As Needed - for Pain 27-Nov-15 13:40  Lasix 20 mg oral tablet 1 tab(s) orally once a day 27-Nov-15 13:40  ranitidine 150 mg oral tablet 1 tab(s) orally 2 times a day 27-Nov-15 13:40  magnesium oxide 400 mg oral tablet 1 tab(s) orally once a day 27-Nov-15 13:40  ProAir HFA CFC free 90 mcg/inh inhalation aerosol 1 puff(s) inhaled every 4 hours, As Needed - for Shortness of Breath 27-Nov-15 13:40   Vital Signs:  :: vital signs stable, patient afebrile.   Physical Exam:  General: well developed, well  nourished, and in no acute distress  Mental Status: normal affect  Eyes: anicteric sclera  Head, Ears, Nose,Throat: Normocephalic, moist mucous membranes, clear oropharynx without erythema or thrush.  Neck, Thyroid: No palpable lymphadenopathy, thyroid midline without nodules.  Respiratory: clear to auscultation bilaterally  Cardiovascular: regular rate and rhythm, no murmur, rub, or gallop  Gastrointestinal: soft, nondistended, nontender, no organomegaly.  normal active bowel sounds  Musculoskeletal: No edema  Skin: No rash or petechiae noted  Neurological: alert, answering all questions appropriately.  Cranial nerves grossly intact  Lymphatics: not palpable neck supraclavicular sumandibular axilla   Laboratory Results: Routine BB:  28-Nov-15 05:51   Direct Coombs, Polyspecific Negative (Result(s) reported on 28 Dec 2013 at 11:11AM.)  Routine Chem:  28-Nov-15 05:51   Iron Binding Capacity (TIBC)  176  Unbound Iron Binding Capacity 163  Iron, Serum  13  Iron Saturation 7 (Result(s) reported on 28 Dec 2013 at 10:29AM.)  Ferritin [facility])  992 (Result(s) reported on 28 Dec 2013 at 17:40CX.)  Folic Acid, Serum 9.5 (Result(s) reported on 28 Dec 2013 at 10:43AM.)  LDH, Serum 145 (Result(s) reported on 28 Dec 2013 at 10:43AM.)  Glucose, Serum  110  BUN 18  Creatinine (comp) 1.17  Sodium, Serum 136  Potassium, Serum 4.1  Chloride, Serum 104  CO2, Serum 26  Calcium (Total), Serum  8.4  Anion Gap  6  Osmolality (calc) 274  eGFR (African American)  58  eGFR (Non-African American)  48 (eGFR values <88m/min/1.73 m2 may be an indication of chronic kidney disease (CKD). Calculated eGFR, using the MRDR Study  equation, is useful in  patients with stable renal function. The eGFR calculation will not be reliable in acutely ill patients when serum creatinine is changing rapidly. It is not useful in patients on dialysis. The eGFR calculation may not be applicable to patients at the low and  high extremes of body sizes, pregnant women, and vegetarians.)  Routine Hem:  28-Nov-15 05:51   Retic Count 1.30  Absolute Retic Count 0.0280 (Result(s) reported on 28 Dec 2013 at 10:26AM.)  WBC (CBC)  19.7  RBC (CBC)  2.14  Hemoglobin (CBC)  7.6  Hematocrit (CBC)  24.7  Platelet Count (CBC) 260  MCV  115  MCH  35.7  MCHC  30.9  RDW  18.5  Neutrophil % 88.5  Lymphocyte % 4.7  Monocyte % 6.5  Eosinophil % 0.0  Basophil % 0.3  Neutrophil #  17.4  Lymphocyte #  0.9  Monocyte #  1.3  Eosinophil # 0.0  Basophil # 0.1 (Result(s) reported on 28 Dec 2013 at 06:39AM.)   Medical Imaging Results:   Review Medical Imaging   PA and Lateral 27-Dec-2013 12:59:00: IMPRESSION:  COPD. There is left basilar atelectasis or pneumonia with tiny left  pleural effusion. No definite pulmonary nodule is demonstrated.  There is new mild enlargement of cardiac silhouette without  pulmonary vascular congestion.    Follow-up films or chest CT scanning are recommended following  anticipated antibiotic therapy.      Electronically Signed    By: David  Martinique    On: 12/27/2013 13:09         Verified By: DAVID A. Martinique, M.D., MD  Assessment and Plan: Impression:   Acute on chronic anemia. Plan:   1. Acute on chronic anemia: Patient's hemoglobin noticed to be decreased from her baseline. Iron stores are also decreased and patient has 200 mg IV Venofer ordered. No other obvious etiology evident on anemia workup. Continue to monitor and patient can follow-up with Dr. Inez Pilgrim as previously scheduled.Neutrophilia: Acute, likely reactive and secondary to her underlying pneumonia. Monitor.Pulmonary nodules: Patient has stated repeatedly that she wants no further workup.Pneumonia: Agree with current antibiotics. consult, will follow.  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

## 2014-05-24 NOTE — Consult Note (Signed)
PATIENT NAME:  Kara Clark, Kara Clark MR#:  [id number] DATE OF BIRTH:  [birth year]  DATE OF CONSULTATION:  08/06/2013  REFERRING PHYSICIAN:   CONSULTING PHYSICIAN:  Lucilla Lame, MD  CONSULTING SERVICE: Gastroenterology.   REASON FOR CONSULTATION: Abdominal pain.   HISTORY OF PRESENT ILLNESS: This patient is a 80 year old woman who has a history of COPD and coronary artery disease who also has a history of a lung mass in the past. The patient states that she was told by her cardiothoracic surgeon this Monday that the lung mass is not cancer. The patient is on home oxygen at the present time. She reports that since last Wednesday she had severe abdominal pain that was excruciating. She reports it to be a 10/10 pain. She also reports that she may be having the pain contributed to by a recent visit from her daughter. The patient has been in the hospital since yesterday and states that she is now completely pain free without any pain symptoms whatsoever. She also reports that she would like to eat. The patient had a CT scan that showed some questionable thickening of the colon, but the patient is not having any symptoms such as diarrhea or rectal bleeding. The patient has been seen by gastroenterology in the past for constipation and states that she is more constipated than  having diarrhea. The patient was noted to have some coffee-ground emesis and some fecal occult blood when she was in the Emergency Department. She also reports that she has been coughing for days, but reports that she is on home oxygen and it is hopeful that this hospital admission will facilitate her stopping her long use of tobacco. There is a constant shortness of breath. She denies any fevers, chills, or chest pain.   PAST MEDICAL HISTORY: Positive for hyperlipidemia, cervical cancer, coronary artery disease, chronic obstructive pulmonary disease, and a history of lung mass.   SOCIAL HISTORY: Positive tobacco use.  Denies alcohol or drugs.    FAMILY HISTORY: Noncontributory.   ALLERGIES: ASPIRIN AND LIDODERM.   HOME MEDICATIONS: Include acetaminophen, simvastatin, hydroxyurea, Plavix, DuoNeb, Spiriva,  Lasix, Zantac, Mag. Ox, sucralfate, and vitamin D.   PHYSICAL EXAMINATION:  GENERAL: The patient is sitting up in bed in no apparent distress, although, tachypneic on nasal cannula.  VITAL SIGNS:  Pulse 69, respirations 18, blood pressure 112/63, pulse oximetry 97% on 2 liters, temperature 98.5.  HEENT: Normocephalic, atraumatic. Extraocular motions intact. Pupils equally round and reactive to light and accommodation without JVD, without lymphadenopathy.  LUNGS: With rhonchi throughout.  ABDOMEN: Soft, nontender, nondistended, without hepatosplenomegaly.  EXTREMITIES: Without cyanosis, clubbing, or edema.  NEUROLOGICAL: Grossly intact.  PSYCHIATRIC: The patient is alert and oriented x 3.   ANCILLARY SERVICES: White cell count 13.8 with hemoglobin 7.9, down from 8.5 on admission, platelet 358,000.   ASSESSMENT AND PLAN: This patient is a 80 year old woman who comes in with what she reports to be 10/10 pain. The patient is now pain-free. She did have hematemesis and heme positive stools. but is at high risk of any endoscopic procedure with her history of chronic obstructive pulmonary disease and need for oxygen and her of present tachypnea. Will suggest advancing the patient's diet, follow her hemoglobin and get an upper gastrointestinal series. The patient has been explained this and understands. No further work-up for her abnormal CT scan in a patient who has not had any change in her bowel habits or diarrhea.   Thank you very much for involving me in the care of  this patient. If you have any questions, please do not hesitate to call.    ____________________________ Lucilla Lame, MD dw:ts D: 08/06/2013 13:11:34 ET T: 08/06/2013 13:27:06 ET JOB#: [id number]  cc: Lucilla Lame, MD, <Dictator> Lucilla Lame MD ELECTRONICALLY SIGNED  08/07/2013 7:09

## 2014-05-24 NOTE — H&P (Signed)
PATIENT NAME:  Kara Clark, CHABOT MR#:  [id number] DATE OF BIRTH:  Jul 24, 1934  DATE OF ADMISSION:  08/05/2013  REFERRING PHYSICIAN: Dr. Owens Shark  PRIMARY CARE PHYSICIAN: Dr. Raechel Ache   CHIEF COMPLAINT: Abdominal pain.   HISTORY OF PRESENT ILLNESS: A 80 year old Caucasian female with history of coronary artery disease, chronic obstructive pulmonary disease, non-O2 dependent, lung mass, presenting with abdominal pain. Describes 2 to 3 day duration of epigastric pain but gradually worsening, nonradiating, described only as "pain." Intensity 10 out of 10, worse with food consumption. No relieving factors. Has diminished p.o. intake the last couple of days as well. She has noted having emesis, coffee-ground, nonbilious. She is noted to be fecal occult positive in the ER as well. She also has been describing cough, about four days' duration, nonproductive, associated with shortness of breath, mainly dyspnea on exertion. Denies chest pain, fevers, or chills. Denies further symptomatology.    REVIEW OF SYSTEMS:  CONSTITUTIONAL: Denies fever, chills, fatigue, weakness.  EYES: No blurry vision, double vision, eye pain.  ENT: Denies tinnitus, ear pain or hearing loss.  RESPIRATORY: Positive for cough as described above. Denies wheezing or hemoptysis.  CARDIOVASCULAR: Denies chest pain, peripheral pulses, edema.  GASTROINTESTINAL: Positive for abdominal pain, nausea and emesis as described above. Denies any diarrhea.  GENITOURINARY: Denies dysuria, hematuria.  ENDOCRINE: Denies nocturia or thyroid problems.  HEMATOLOGIC: Denies easy bruising, bleeding.  SKIN: Denies rashes or lesions.  MUSCULOSKELETAL: Denies pain in neck, back, shoulder, knees, hips, arthritic symptoms.  NEUROLOGIC: Denies paralysis, paresthesias.  PSYCHIATRIC: Denies anxiety or depression. Otherwise, full review of systems performed by me is negative.   PAST MEDICAL HISTORY: Hyperlipidemia, cervical cancer, coronary artery disease, chronic  obstructive pulmonary disease, history of lung mass.   SOCIAL HISTORY: Positive for history of tobacco use. Denies any alcohol or drug use.   FAMILY HISTORY: Positive for COPD, CVA, as well as coronary artery disease.   ALLERGIES: ASPIRIN AND LIDODERM.   HOME MEDICATIONS: Include acetaminophen 325 mg 2 tabs p.o. q.4h., Simvastatin 20 mg p.o. at bedtime, hydroxyurea 500 mg p.o. daily, Plavix 75 mg p.o. daily, DuoNeb treatments 3 mL 4 times a day as needed for shortness of breath, Spiriva 18 mcg inhalation once daily, Lasix 20 mg p.o. daily, ranitidine 150 mg p.o. b.i.d., Mag-Ox 400 mg p.o. daily, sulcralfate 1 gram p.o. b.i.d., Roflumilast 500 mcg p.o. daily, vitamin D3 2000 international units p.o. daily.   PHYSICAL EXAMINATION: VITAL SIGNS: Temperature 98.5, heart rate 92, respirations 26, blood pressure 144/67, saturating 97% on supplemental O2. Weight 55.8 kg, BMI 21.1.  GENERAL: Frail-appearing Caucasian female, currently in no acute distress.  HEAD: Normocephalic, atraumatic.  EYES: Pupils equal, round, reactive to light. Extraocular muscles intact. No scleral icterus.  MOUTH: Moist mucous membranes. Dentition intact. No abscess noted.  EARS, NOSE, AND THROAT: Clear, without exudates. No external lesions.  NECK: Supple. No thyromegaly. No nodules. No JVD.  PULMONARY: Coarse breath sounds, scattered wheezing throughout lung fields. Otherwise clear to auscultation. Good breath sounds bilaterally. Good respiratory effort.  CHEST: Nontender to palpation.  CARDIOVASCULAR: S1, S2, regular rate and rhythm. No murmurs, rubs, or gallops. No edema. Pedal pulses 2+ bilaterally.  GASTROINTESTINAL: Soft, nontender, nondistended. No masses. Positive bowel sounds. No hepatosplenomegaly.  MUSCULOSKELETAL: No swelling, clubbing, or edema. Range of motion full in all extremities.  NEUROLOGIC: Cranial nerves II to XII intact. No gross focal neurologic deficit. Sensation intact. Reflexes intact. SKIN: No  ulceration, lesions, rash, cyanosis. Skin warm and dry. Turgor intact.  PSYCHIATRIC:  Mood and affect within normal limits. The patient is awake, alert, oriented x3. Insight and judgment intact.   LABORATORY DATA: Sodium 136, potassium 3.3, chloride 102, bicarbonate 25, BUN 15, creatinine 1.16, glucose 95. LFTs: Albumin 3.1, alkaline phosphatase 145, AST 14, ALT 9. Troponin less than 0.02. WBC 13, hemoglobin 8.5, platelets of 389. Chest x-ray performed: Emphysematous changes with lower lobe nodule. CT abdomen and pelvis performed: Nonspecific thickening of the right colon, no acute processes in the abdomen.   ASSESSMENT AND PLAN: A 80 year old Caucasian female with history of coronary artery disease, chronic obstructive pulmonary disease, and lung mass, presenting with abdominal pain.  1.  Gastrointestinal bleed. Trend CBCs q.6h. Type and cross. Transfuse for hemoglobin less than 7. Gastroenterology consult. Place on Protonix drip.  2.  Chronic obstructive pulmonary disease exacerbation. DuoNeb treatments q.4h. and oxygen to keep oxygen saturation greater than 92%. Incentive spirometry as well as Solu-Medrol and azithromycin.  3.  Hypokalemia. Replace to goal of 4. 4.  Hyperlipidemia. Continue statin therapy.  5.  Deep venous thrombosis prophylaxis with sequential compression devices.   CODE STATUS: The patient is full code.  TIME SPENT: 45 minutes.   ____________________________ Aaron Mose. Hower, MD dkh:cg D: 08/06/2013 00:25:16 ET T: 08/06/2013 02:53:42 ET JOB#: [id number]  cc: Aaron Mose. Hower, MD, <Dictator> DAVID Woodfin Ganja MD ELECTRONICALLY SIGNED 08/07/2013 3:24

## 2014-05-24 NOTE — Discharge Summary (Signed)
PATIENT NAME:  Kara Clark, Kara Clark MR#:  [id number] DATE OF BIRTH:  [birth year]  DATE OF ADMISSION:  08/05/2013 DATE OF DISCHARGE:  08/09/2013  CONSULTANTS: Lucilla Lame, MD from GI.   CHIEF COMPLAINT: Abdominal pain.   DISCHARGE DIAGNOSES: 1.  Possibly acute gastrointestinal bleed, likely upper in nature with acute on chronic symptomatic  blood loss anemia.  2.  Chronic respiratory failure.  3.  Hypokalemia.  4.  Hyperlipidemia.  5.  Tobacco abuse.  6.  History of chronic obstructive pulmonary disease.  7.  History of cervical cancer.  8.  History of coronary artery disease.  9.  History of lung mass.   DISCHARGE MEDICATIONS: Vitamin D3 2000 units once a day, Mag-Ox 400 one capsule once, sucralfate 1 gram 2 times a day, Spiriva 18 mcg 1 capsule once a day, acetaminophen 650 mg every 4 hours as needed, albuterol/ipratropium 3 mL 4 times a day inhaled for shortness of breath as needed, Lasix 20 mg once a day, roflumilast 500 mcg a 1 tablet once a day, hydroxyurea 500 mg 2 times a week, ProAir 1 puff every 4 hours, Advair 1 puff every 12 hours, pantoprazole 40 mg 2 times a day, prednisone taper 50 mg a day and then taper by 10 mg until done in 5 days, Levaquin 500 mg every 24 hours for 4 days, simvastatin 20 mg at bedtime.   DISCHARGE INSTRUCTIONS:  Please stop taking Plavix until you are told to resume by your doctor.  She will be getting discharged with home health, PT and R.N. with 2 liters of oxygen, continuous.   DIET: Low sodium, low fat, low cholesterol.   ACTIVITY: As tolerated.   FOLLOWUP: Please follow with PCP within 1-2 weeks. Please follow with GI within 1-2 weeks. If any worsening of shortness of breath, fevers or recurrent bleeding or any other concerns or issues, call your doctor right away.   DISPOSITION: Home.   CODE STATUS: The patient is full code.   SIGNIFICANT LABS AND IMAGING: Initial BUN 15, creatinine 1.16, sodium was 136, potassium 3.3. Initial hemoglobin 8.5,  lowest hemoglobin 7.3. Last hemoglobin 9.3. Initial white count of 13, last white count of 9.7. Urinalysis did not suggest an infection. CT of abdomen and pelvis with contrast shows nonspecific wall thickening in the right colon. It could be acute on chronic colitis. No evidence of a bowel obstruction or diverticulitis. Consolidation in the right lung base may suggest pneumonia. Upper GI study was incomplete upper GI evaluation as it got stopped prematurely.   HISTORY OF PRESENT ILLNESS AND HOSPITAL COURSE: For full details of H and P, please see the dictation on July 6th, by Dr. Lavetta Nielsen, but briefly, this 80 year old with COPD, chronic respiratory failure.  She told me that was supposed to be on oxygen and it was ordered 3 times, but she does not have it for some reason that she does not know.  Came in with epigastric pain, which was gradually worsening, nonradiating without any relieving factors. The patient had diminished p.o. intake.  Also noted to have some emesis which is coffee ground, nonbilious.   She came into the hospital where she was found to have positive fecal occult stool. She also has been having a cough for 4 days, nonproductive with shortness of breath, and dyspnea on exertion. She has had no fevers or chills. She was admitted to the hospitalist service for suspected GI bleed, possibly upper in nature. She also was noted to have a COPD exacerbation and acute  bronchitis. She was started on fluids, nebulizers and inhalers.   In regards to the GI bleed, the patient did have down trend in her hemoglobin. Of note, hemoglobin was 11.1 in late May and came in with a hemoglobin in the 8th and did trend down to as low as low as 7s. She did get a unit of blood transfused for symptomatic blood loss anemia, likely upper GI bleed in nature. She was seen by GI, but they wanted followup as an outpatient, given her respiratory status. At this point, her blood counts were going up and her energy level is up.  She is not bleeding any further.   In regards to her respiratory failure, she did drop her saturations here and did qualify for oxygen, so we will send her out with 2 liters of oxygen. She will benefit from this. She did have likely acute bronchitis with COPD. The patient has had no significant pneumonia on x-ray of the chest. She was initially started on azithromycin, but given the inconspicuous findings in the CAT scan, I did change that to Levaquin. The patient did improve significantly with the Levaquin.  She has had no fevers. White count did normalize. I suspect that this might be more of chronic respiratory failure with acute on COPD exacerbation and acute bronchitis, more so than pneumonia, but at this point, she is significantly improved. She will be discharged with 4 more days of Levaquin. She did have mild hypokalemia which got replaced. She will be discharged with a home health, PT and R.N.   PHYSICAL EXAMINATION: VITAL SIGNS: On the day of discharge, temperature is 97.8, pulse rate 90, respiratory rate 17, blood pressure 112/57, oxygen saturation 98% on oxygen.  It does drop to 84% with  ambulation on room air, but it improved to 94% on 2 liters with rest.  HEENT:  At this point, she is normocephalic, atraumatic.  LUNGS: Clear, without significant wheezing, rhonchi, or rails.  HEART:  Normal S1, S2.  ABDOMEN: Soft, mild epigastric tenderness. No rebound or guarding.  EXTREMITIES:  No edema in the lower extremities.   Per Dr. Allen Norris, she should follow up with him as an outpatient as she is not a good candidate for any procedures at this time. Due to the respiratory issues, she should follow up as an outpatient to further investigate and see if her hemoglobin is increasing.   TOTAL TIME SPENT: 40 minutes.   CODE STATUS:  The patient is full code.   ____________________________ Vivien Presto, MD sa:ds D: 08/09/2013 14:11:16 ET T: 08/09/2013 22:47:20 ET JOB#: [id number]  cc: Vivien Presto, MD, <Dictator> Lucilla Lame, MD Christena Flake. Raechel Ache, MD   Vivien Presto MD ELECTRONICALLY SIGNED 08/29/2013 14:56

## 2014-05-24 NOTE — H&P (Signed)
PATIENT NAME:  Kara Clark, Kara Clark MR#:  [id number] DATE OF BIRTH:  09-Nov-1934  DATE OF ADMISSION:  12/27/2013  PRIMARY CARE PHYSICIAN: Christena Flake. Raechel Ache, MD   CHIEF COMPLAINT: Shortness of breath.   HISTORY OF PRESENT ILLNESS: This is a 80 year old female who presents to the hospital with worsening shortness of breath over the past few days along with a cough, which is productive with clear sputum. The patient says that she was in her usual state of health when she developed these symptoms a few days back, which has progressively gotten worse. She denies any hemoptysis. She admits to some nausea and some vomiting, which was nonbloody in nature. She denies any abdominal pain. No diarrhea. No sick contacts. The patient was brought to the ER, underwent a chest x-ray, which showed a possible left lower lobe infiltrate consistent with pneumonia and also leukocytosis. A clinical diagnosis of pneumonia was made and hospitalist services were contacted for further treatment and evaluation.   REVIEW OF SYSTEMS:  CONSTITUTIONAL: No documented fever. Positive generalized weakness. No weight gain, no weight loss.  EYES: No blurred or double vision.  ENT: No tinnitus. No postnasal drip. No redness of the oropharynx.  RESPIRATORY: Positive cough. No wheeze. No hemoptysis. Positive COPD.  CARDIOVASCULAR: Positive right-sided pleuritic chest pain. No orthopnea. No palpitations, no syncope.  GASTROINTESTINAL: Positive nausea. Positive vomiting. No diarrhea. No abdominal pain. No melena or hematochezia.  GENITOURINARY: No dysuria, no hematuria.  ENDOCRINE: No polyuria or nocturia. No heat or cold intolerance.  HEMATOLOGY: Chronic anemia, no acute bruising or bleeding.  INTEGUMENTARY: No rashes. No lesions.  MUSCULOSKELETAL: No arthritis. No swelling. No gout.  NEUROLOGIC: No numbness, tingling. No ataxia. No seizure activity.  PSYCHIATRIC: No anxiety, no insomnia. No ADD.   PAST MEDICAL HISTORY: Consistent with COPD  with ongoing tobacco abuse, history of eosinophilia, history of pulmonary nodules, cervical cancer, coronary artery disease.   ALLERGIES: ASPIRIN AND LIDODERM.   SOCIAL HISTORY: Still smokes about a few cigarettes per day, has been smoking on and off for the past 60+ years. No alcohol abuse. No illicit drug abuse. Lives by herself.   FAMILY HISTORY: Mother and father are both deceased. Both died from complications of heart disease and stroke.   CURRENT MEDICATIONS: As follows: Tylenol 650 q.4 hours as needed, Lasix 20 mg daily, magnesium oxide 400 mg daily, albuterol inhaler 1 puff 4 times daily as needed, ranitidine 150 mg b.i.d., Spiriva 1 puff daily, vitamin D3 at 2000 international units daily.   PHYSICAL EXAMINATION: Presently is as follows:  VITAL SIGNS: Noted to be temperature 99.2, pulse 122, respirations 28, blood pressure 11172, sats 95% on room air.  GENERAL: The patient is a pleasant-appearing female in mild distress. HEAD, EYES, EARS, NOSE, AND THROAT: Atraumatic, normocephalic. Extraocular muscles are intact. Pupils equal and reactive to light. Sclerae are anicteric. No conjunctival injection. No pharyngeal erythema.  NECK: Supple. There is no jugular venous distention. No bruits, no lymphadenopathy or no thyromegaly.  HEART: Regular rate and rhythm, tachycardic. No murmurs, no rubs, or clicks.  LUNGS: Clear to auscultation bilaterally. No rales or rhonchi. No wheezes.  ABDOMEN: Soft, flat, nontender, nondistended. Has good bowel sounds. No hepatosplenomegaly appreciated.  EXTREMITIES: No evidence of any cyanosis, clubbing, or peripheral edema. Has +2 pedal and radial pulses bilaterally.  NEUROLOGIC: The patient is alert, awake, and oriented x3 with no focal motor or sensory defecits bilaterally.  SKIN: Moist and warm with no rashes appreciated.  LYMPHATIC: There is no cervical or  axillary lymphadenopathy.   LABORATORY DATA: Serum glucose of 101, BUN 18, creatinine 1.1, sodium  133, potassium 4.1, chloride 100, bicarbonate 25, troponin 0.02. White cell count 22.6, hemoglobin 9.5, hematocrit 29, platelet count of 335,000.   The patient did undergo a chest x-ray, which showed COPD and left basal atelectasis or pneumonia with a tiny left pleural effusion. No definite pulmonary nodule is demonstrated.   ASSESSMENT AND PLAN: This is a 80 year old female with history of chronic obstructive pulmonary disease with ongoing tobacco abuse, history of eosinophilia, history of pulmonary nodule, cervical cancer, coronary artery disease, who presents to the hospital with cough, shortness of breath, and weakness and noted to have a pneumonia.   1.  Pneumonia. This was likely community-acquired pneumonia, likely cause of the patient's shortness of breath, leukocytosis, and cough. I will treat the patient with IV Levaquin. Follow blood and sputum cultures and follow her clinically.  2.  Chronic obstructive pulmonary disease. No acute chronic obstructive pulmonary disease exacerbation related to pneumonia. We will continue her Spiriva and place her on p.r.n. DuoNebs.  3.  Leukocytosis, likely secondary to pneumonia. I will follow white cell count with IV antibiotic therapy.  4.  History of chronic anemia. The patient's hemoglobin is currently stable. No acute bleeding.  5.  Nicotine dependence. I will place her on Nicotrol inhaler.  6.  History of a pulmonary nodule. The pulmonary nodule was not seen on the chest x-ray today. As per the patient, she does not want any further workup for the nodule or does not want any treatment even if it is malignant. The patient is a DO NOT INTUBATE/DO NOT RESUSCITATE.  TIME SPENT ON ADMISSION: 50 minutes    ____________________________ Belia Heman. Verdell Carmine, MD vjs:sw D: 12/27/2013 16:02:13 ET T: 12/27/2013 16:19:44 ET JOB#: [id number]  cc: Belia Heman. Verdell Carmine, MD, <Dictator> Henreitta Leber MD ELECTRONICALLY SIGNED 01/04/2014 15:56

## 2014-05-24 NOTE — Consult Note (Signed)
Chief Complaint:  Subjective/Chief Complaint The patient reports feeling better today. Was unable to finish the upper GI due to coughing. Patient was transfused. Has anemia with heme positive stools.   VITAL SIGNS/ANCILLARY NOTES: **Vital Signs.:   09-Jul-15 16:20  Vital Signs Type Q 8hr  Temperature Temperature (F) 97.8  Celsius 36.5  Temperature Source oral  Pulse Pulse 100  Respirations Respirations 18  Systolic BP Systolic BP 111  Diastolic BP (mmHg) Diastolic BP (mmHg) 65  Mean BP 84  Pulse Ox % Pulse Ox % 97  Pulse Ox Activity Level  At rest  Oxygen Delivery 2L  *Intake and Output.:   Shift 09-Jul-15 23:00  IV (Secondary)      In:  58   Brief Assessment:  GEN well developed, well nourished, no acute distress   Respiratory normal resp effort   Gastrointestinal Normal   Additional Physical Exam Alert and orientated times 3   Lab Results: Routine Hem:  09-Jul-15 05:21   WBC (CBC)  12.0  RBC (CBC)  2.36  Hemoglobin (CBC)  8.6  Hematocrit (CBC)  26.2  Platelet Count (CBC) 361 (Result(s) reported on 08 Aug 2013 at 08:34AM.)  MCV  111  MCH  36.5  MCHC 32.9  RDW  24.2  Bands 7  Segmented Neutrophils 77  Lymphocytes 14  Monocytes 2  Diff Comment 6 ANISOCYTOSIS  Diff Comment 7 POIKILOCYTOSIS  Diff Comment 8 HYPOCHROMIA  Diff Comment 9 PLTS VARIED IN SIZE  Result(s) reported on 08 Aug 2013 at 08:34AM.   Assessment/Plan:  Assessment/Plan:  Assessment Anemia with heme positve stools and abd pain.   Plan Hb stable.  Patient poor candidate for any procedures at thsi time due to respitory issues. Should follow up as an out patient so further investigation and to see if the Hb increases.   Electronic Signatures: Lucilla Lame (MD)  (Signed 09-Jul-15 17:31)  Authored: Chief Complaint, VITAL SIGNS/ANCILLARY NOTES, Brief Assessment, Lab Results, Assessment/Plan   Last Updated: 09-Jul-15 17:31 by Lucilla Lame (MD)

## 2014-05-24 NOTE — Discharge Summary (Signed)
PATIENT NAME:  Kara Clark, Kara Clark MR#:  [id number] DATE OF BIRTH:  1934-06-03  DATE OF ADMISSION:  12/27/2013 DATE OF DISCHARGE:  12/31/2013  FINAL DIAGNOSES:  1.  Pneumonia bilateral due to unidentified infectious organism.  2.  Nausea, vomiting and epigastric pain.  3.  Anemia, unspecified type.  4.  Chronic respiratory failure with chronic obstructive pulmonary disease.   MEDICATIONS ON DISCHARGE: Include vitamin D 2000 international units daily, Spiriva 1 inhalation daily, acetaminophen 325 mg 2 tablets every 4 hours as needed for pain, Lasix 20 mg daily, ranitidine 150 mg twice a day, magnesium oxide 400 mg daily, ProAir HFA CFC 1 puff every 4 hours as needed for shortness of breath, Ensure Plus 237 mL twice a day for 30 days, nicotine inhalation device 1 cap inhaled every 1 to 2 hours as needed for nicotine withdrawal. Levofloxacin 750 mg 1 tablet every 48 hours for 6 more days.   DIET: Regular diet, regular consistency.   ACTIVITY: As tolerated.   FOLLOW-UP:  Dr. Allen Norris, gastroenterology, as outpatient. Dr. Inez Pilgrim hematology as outpatient in 1 to 2 weeks with Dr. Raechel Ache.     HOSPITAL COURSE: The patient was admitted 12/27/2013, discharged 12/31/2013.  The patient was admitted with shortness of breath, suspected pneumonia, chronic obstructive pulmonary disease exacerbation, started on steroids and antibiotics.   LABORATORY AND RADIOLOGICAL DATA DURING THE HOSPITAL COURSE: Included EKG showed sinus tachycardia, premature ventricular complexes, right bundle branch block, protein electrophoresis, high immunoglobulin G, haptoglobin high at 209. Vitamin B12 of 625,  blood cultures negative. Glucose 101, BUN 18, creatinine 1.1, sodium 133, potassium 4.1, chloride 100, CO2 of 25, calcium 8.9. Troponin negative. White blood cell count 22.6, hemoglobin and hematocrit 9.5 and 29.0, platelet count of 335,000.  Chest x-ray showed COPD, left basilar atelectasis or pneumonia, tiny left pleural effusion.  Blood  culture negative.  ABG showed a pH of 7.45, pCO2 of 34, pO2 of 34. This was a venous ABG.   Ferritin 683, folic acid 9.5. Retic count 1.3, LDH 145, direct Coombs negative. Iron saturation 7, iron serum 13, TIBC 176, urine immuno-electrophoresis showed an M-spike, total protein urine 48.5.  Sputum culture growth-like growth gram-negative rod.  Hemoglobin dipped down to 7.2 on 11/30 after transfusion up to 9.3 on 12/01.   CT scan of the abdomen and pelvis showed bibasilar pulmonary airspace disease, which is new on the left, slightly more prominent on the right compared to prior airspace comprised from prior CT scan suggest bibasilar pneumonia. The patient had a normal lipase at 81. Liver function tests and alkaline phosphatase slightly high at 179.  Other liver function tests normal, except for the albumin slightly low at 2.7.   HOSPITAL COURSE PER PROBLEM LIST:  1.  For the pneumonia, bilateral due to unidentified infectious organism, gram-negative rods, out of the sputum. The patient will be discharged home on high-dose Levaquin every other day to complete a course for pneumonia treatment.  2.  Nausea, vomiting, and epigastric pain. The patient complained that has been going on for a while. The patient was doing better 12/01.  CAT scan was negative.  Lipase normal, seen in consultation by Dr. Rayann Heman, who ordered the CAT scan. The patient has had numerous testing in the past for this, had a recent endoscopy by Dr. Allen Norris as an outpatient and can follow up as outpatient with Dr. Allen Norris.  She refused Protonix and wants to go back on her ranitidine twice a day.  3.  Anemia, unspecified type.  Follows with Dr. Inez Pilgrim as an outpatient.  She did get 1 unit of blood while here and also an iron infusion.  Follow up as outpatient for either iron infusion or even Procrit injections. The patient does receive B12 injections as outpatient also.  3.  Chronic respiratory failure with chronic obstructive pulmonary disease.  Upon  discharge, she was moving better air, kept on her usual medications, ProAir and the Spiriva.  No need for steroid taper.  4.  M-spike seen on urine protein electrophoresis. Follow-up with Dr. Inez Pilgrim as outpatient for this.  The patient was seen in consultation during the hospital course by Dr. Grayland Ormond, Dr. Marylene Land partner.   Time Spent ON discharge: 35 minutes.     ____________________________ Tana Conch. Leslye Peer, MD rjw:DT D: 12/31/2013 14:02:33 ET T: 12/31/2013 15:31:15 ET JOB#: [id number]  cc: Tana Conch. Leslye Peer, MD, <Dictator> Christena Flake. Raechel Ache, MD  Marisue Brooklyn MD ELECTRONICALLY SIGNED 01/01/2014 14:25

## 2014-05-24 NOTE — Consult Note (Signed)
Chief Complaint:  Subjective/Chief Complaint Patient pain free and Hb stable. No sign of any GI bleeding.   Brief Assessment:  GEN well developed, well nourished   Respiratory normal resp effort   Gastrointestinal Normal   Gastrointestinal details normal Soft  Nontender  Nondistended   Additional Physical Exam Alert and orientated times 3   Lab Results: Routine Hem:  10-Jul-15 04:43   WBC (CBC) 9.7  RBC (CBC)  2.45  Hemoglobin (CBC)  9.3  Hematocrit (CBC)  28.1  Platelet Count (CBC) 369  MCV  115  MCH  37.9  MCHC 33.0  RDW  23.2  Neutrophil % 87.1  Lymphocyte % 8.0  Monocyte % 4.8  Eosinophil % 0.0  Basophil % 0.1  Neutrophil #  8.5  Lymphocyte #  0.8  Monocyte # 0.5  Eosinophil # 0.0  Basophil # 0.0 (Result(s) reported on 09 Aug 2013 at 07:49AM.)   Assessment/Plan:  Assessment/Plan:  Assessment Anemia with abd pain.   Plan Patient now pain free and Hb stable. Patient wants to go home. She should follow up with Dr. Vira Agar, her regular GI doc as an out patient for a further work up of her anemia.   Electronic Signatures: Lucilla Lame (MD)  (Signed 10-Jul-15 15:13)  Authored: Chief Complaint, Brief Assessment, Lab Results, Assessment/Plan   Last Updated: 10-Jul-15 15:13 by Lucilla Lame (MD)

## 2014-05-24 NOTE — Consult Note (Signed)
Brief Consult Note: Diagnosis: The patient was admitted with abd pain since last week. She says the pain was constant and now reports that it has completely resolved. She was found to have heme positive stolols that were dark.   Patient was seen by consultant.   Consult note dictated.   Comments: The patient had abd pain that has resolved. Heme positive dark stools with low Hb. Would treat with a PPI and will order an upper GI due to patients lung disease making her a high risk for any GI procedure.  Electronic Signatures: Lucilla Lame (MD)  (Signed 07-Jul-15 12:23)  Authored: Brief Consult Note   Last Updated: 07-Jul-15 12:23 by Lucilla Lame (MD)

## 2014-05-28 DIAGNOSIS — Z66 Do not resuscitate: Secondary | ICD-10-CM | POA: Insufficient documentation

## 2014-05-28 DIAGNOSIS — R918 Other nonspecific abnormal finding of lung field: Secondary | ICD-10-CM | POA: Insufficient documentation

## 2014-05-28 NOTE — Consult Note (Signed)
PATIENT NAME:  Kara Clark, Kara Clark MR#:  [id number] DATE OF BIRTH:  [birth year]  DATE OF CONSULTATION:  12/30/2013  REFERRING PHYSICIAN:  Tana Conch. Leslye Peer, MD CONSULTING PHYSICIAN:  Arther Dames, MD  REASON FOR CONSULTATION: Abdominal pain, nausea, vomiting.   HISTORY OF PRESENT ILLNESS: Ms. Blacketer is a 80 year old female with a past medical history notable for COPD, pulmonary nodules, coronary artery disease, who initially presented to the hospital for evaluation of shortness of breath and cough. She was found to have a likely pneumonia and was started on treatment. During the course of this, it also came to light that she has been having trouble with nausea, vomiting, and abdominal pain. The abdominal pain is epigastric. The epigastric pain she reports has been going on for well over a month, but has become much worse in the past few days. She also reports that the pain is worse after eating foods and she  particularly mentions after eating a burrito and after eating chocolate.   Of note, her last CT scan was in July 2015. At that time she did have gallstones and also had thickening of the right colon.   PAST MEDICAL HISTORY:  1.  COPD. 2.  Pulmonary nodule.  3.  Cervical cancer.  4.  Coronary artery disease.   ALLERGIES: ASPIRIN AND LIDODERM.   HOME MEDICATIONS:  1.  Tylenol as needed. 2.  Lasix 20 mg daily. 3.  Mag-Ox 400 daily. 4.  Albuterol 1 puff 4 times a day.  5.  Ranitidine 150 mg b.i.d.  6.  Spiriva 1 puff daily.  7.  Vitamin D3, 2000 units daily.   SOCIAL HISTORY: Ongoing smoker, denies alcohol.   FAMILY HISTORY: No family history of GI malignancy that she is aware of.  REVIEW OF SYSTEMS:  A 10 system review was  conducted and was negative except as stated in the HPI.   PHYSICAL EXAMINATION: VITAL SIGNS: Currently, her temperature is 98.3. Pulse is 78. Respirations are 18, blood pressure is 107/69, pulse oximetry is 93% on 3 liters. GENERAL: Appears to be mildly  dyspneic.  HEENT: Normocephalic/atraumatic. Extraocular movements are intact. Anicteric. Nasal cannula in place.  NECK: Soft, supple. JVP appears normal. No adenopathy. CHEST: Mild coarse breath sounds bilaterally. HEART: Regular. No murmur, rub, or gallop.  Normal S1 and S2. ABDOMEN:  Positive for epigastric tenderness to palpation.  Soft, normoactive bowel sounds.  No rebound or guarding.  EXTREMITIES: No swelling, well perfused. SKIN: No rash or lesion. Skin color, texture, turgor normal. NEUROLOGICAL: Grossly intact. PSYCHIATRIC: Normal tone and affect. MUSCULOSKELETAL: No joint swelling or erythema.   LABORATORY DATA:  Currently sodium is 137, potassium 3.6, BUN 17, creatinine 1.09. Lipase is 81, ferritin is 992.  Her liver enzymes are normal except alkaline phosphatase is elevated at 179.  Her albumin is 2.7.  Her white count currently is 11.  Hemoglobin is 7.2, hematocrit is 22.  Her platelets are 279,000.    IMAGING:  As stated above, her last CT scan was in July which showed gallstones and also right-sided colonic thickening.    ASSESSMENT AND PLAN:  Epigastric pain, nausea, vomiting: She does seem to have significant epigastric tenderness on exam. This also seems to be quite a troubling issue for her, especially over the past couple days. She does have a history of both gallstones and of colonic thickening on CT about 5 months ago. Therefore, I do think it would be warranted to perform a CT of abdomen and pelvis, both to follow  up the colonic thickening and also to evaluate for evidence of cholecystitis. In addition, I will order liver enzymes as I do not see that they have been done this admission. I will also check a lipase.   RECOMMENDATIONS: 1.  CT abdomen and pelvis with contrast to follow up right colonic thickening and gallstones. 2.  We will check liver enzymes and lipase.  3.  To perform FOBT testing. However, the anemia based on the iron studies does not appear to be  consistent with an iron deficiency anemia.  4.  Will follow.   Thank you for this consult.   ____________________________ Arther Dames, MD mr:LT D: 12/30/2013 17:19:52 ET T: 12/30/2013 17:46:05 ET JOB#: [id number]  cc: Arther Dames, MD, <Dictator> Mellody Life MD ELECTRONICALLY SIGNED 02/03/2014 14:16

## 2014-06-01 NOTE — Consult Note (Signed)
PATIENT NAME:  Kara Clark, Kara Clark MR#:  [id number] DATE OF BIRTH:  April 20, 1934  DATE OF CONSULTATION:  05/05/2014  REFERRING PHYSICIAN:   CONSULTING PHYSICIAN:  Harrell Gave A. Sybil Shrader, MD  ATTENDING PHYSICIAN:  Glena Norfolk. Rodrigus Kilker, M.D.   REASON FOR CONSULTATION: Epigastric pain, nausea, vomiting, and gallstones.   HISTORY OF PRESENT ILLNESS: Kara Clark is a pleasant 80 year old female who I had seen previously for evaluation for gallstones who presents with 1 day of nausea, vomiting, and epigastric pain. Her pain she says it is constant, just worsened over the last day that extends into her chest and down into her lower abdomen.  Currently is without pain. Last vomiting was a few hours ago. No fevers, chills, night sweats, shortness of breath, cough, current chest pain, current abdominal pain. No recent nausea, vomiting prior to coming in.  No diarrhea or constipation.  Has had a history of similar pain over the last few months which had just gotten better on their own.  Also was noted to have an elevated white blood cell count of 14.1 with 89% neutrophil predominance and troponin of 0.14 possibly due to NSTEMI or cardiac demand.   PAST MEDICAL HISTORY:  1.  Congestive heart failure.  2.  Hyperlipidemia.  3.  COPD on 2 liters oxygen at home.  4.  History of cervical cancer.  5.  History of coronary artery disease status post MI in 2001.  6.  History of GI bleed.  7.  History of nephrectomy.  8.  History of cataract surgery.  9.  History of total abdominal hysterectomy.  10.  MI with PTCA.   HOME MEDICATIONS ARE AS FOLLOWS:  1.  Lasix p.o. daily. 2.  Magnesium oxide p.o. daily. 3.  ProAir HFA q. 4 hours.  4.  Ranitidine b.i.d.  5.  Spiriva 18 mcg inhalation daily.  6.  Vitamin D. 7.  Carafate per patient.   SOCIAL HISTORY:  1/3 to 1/2 packs per day. No alcohol use.   ALLERGIES: ASPIRIN AND LIDODERM.  REVIEW OF SYSTEMS:  Full review obtained. Pertinent positives and  negatives as above.   PHYSICAL EXAMINATION:  VITAL SIGNS: Temperature 98.8, pulse 89, blood pressure 124/65, respirations 18, 96% on 2 liters.  GENERAL: No acute distress. Alert and oriented x 3.  HEAD: Normocephalic, atraumatic.  EYES: No scleral icterus. No conjunctivitis.  FACE:  No obvious facial trauma.  NOSE:  Normal external nose.  EARS:  Normal external ears.  CHEST: Lungs clear to auscultation, moving air well.  HEART: Regular rate and rhythm. No murmurs, rubs, or gallops. ABDOMEN: Soft, nontender, nondistended.  EXTREMITIES: Moves all extremities well. Strength 5/5.  NEUROLOGIC: Cranial nerves II through XII grossly intact.   LABORATORY DATA: Significant for white cell count of 14.1, creatinine is mildly elevated at 1.02, lipase is 54. LFTs are normal. Troponin is 0.14.   CT scan of the chest shows a 2 cm left upper lobe nodule consistent with bronchogenic carcinoma.   Abdomen is cholelithiasis, extensive diverticulosis.   ASSESSMENT AND PLAN: Kara Clark is a 80 year old female with recurrent epigastric pain, nausea, vomiting, multiple episodes of gallstones.  Patient is nontender now although has a white blood cell count and low suspicion for cholecystitis due to lack of pain and lack of findings on CT scan.  In addition, if this symptomatic cholelithiasis patient is a poor operative candidate with her congestive heart failure, history of myocardial infarction, and chronic obstructive pulmonary disease and I would likely not offer cholecystectomy.  If  she continues to have pain may benefit from a HIDA scan which would showed potential for cholecystitis in which case patient would be on antibiotics and if it does not improve would likely benefit from cholecystostomy tube. No acute surgical needs.    ____________________________ Glena Norfolk Peyson Postema, MD cal:sp D: 05/05/2014 01:05:44 ET T: 05/05/2014 09:02:23 ET JOB#: [id number]  cc: Harrell Gave A. Ercell Razon, MD,  <Dictator> Floyde Parkins MD ELECTRONICALLY SIGNED 05/13/2014 17:41

## 2014-06-01 NOTE — Discharge Summary (Signed)
PATIENT NAME:  Kara Clark, BIEDA MR#:  [id number] DATE OF BIRTH:  Jul 24, 1934  DATE OF ADMISSION:  05/02/2014 DATE OF DISCHARGE:  05/05/2014  ADMISSION DIAGNOSES: 1.  Elevated troponins.  2.  Nausea and vomiting with abdominal pain.  DISCHARGE DIAGNOSES:  1.  Nausea and vomiting with abdominal pain, likely gastroenteritis. This has since resolved.  2.  Elevated troponin due to demand ischemia.  3.  History of coronary artery disease.  4.  Newly found lung mass on CT scan.   CONSULTATIONS:  1.  Christopher A. Lundquist, M.D. 2.  Cardiology.  PERTINENT LABORATORIES: Discharge troponin maximum 0.17. Troponin at discharge 0.15. White blood cells 10, hemoglobin 9.2, hematocrit 29, platelets of 337,000. INR is 1.2.   Sodium 138, potassium 3.8, chloride 103, bicarbonate 26, BUN 13, creatinine 0.94, glucose is 94.   CT of the chest was negative for pulmonary emboli, but there was a 2 cm left upper lobe nodule consistent with bronchogenic carcinoma. No definite metastatic disease seen.   CT of the abdomen and pelvis:  Cholelithiasis, no acute abdominal findings.   HOSPITAL COURSE:  A 80 year old female who presented on April 3 with nausea and vomiting, epigastric abdominal pain, found to incidentally have elevated troponins. For further details, please refer to the H and P.  1.  Elevated troponins.  Dr. Ubaldo Glassing from cardiology is seeing the patient.  Patient did have PTCA in 2001 with a history of CAD.  Her troponins actually have decreased.  Her troponin maximum is 0.17. This is suspected to be mild troponin elevation due to demand ischemia and not acute coronary syndrome. She does have a cardiomyopathy. Had an echocardiogram in March 2016 which showed an EF of 40% with global hypokinesis. Will continue with metoprolol, statin, no aspirin as the patient has a history of anaphylaxis. There could be consideration of Plavix should there be significant wall motion abnormalities on repeat echocardiogram. At  this time, the patient does not have acute coronary syndrome and she will follow up with cardiology.  2.  Epigastric abdominal pain which is likely from gastroenteritis as her nausea and vomiting has resolved. Dr. Rexene Edison had seen the patient in consultation regarding the fact that she had gallstones on her CT scan, but at this time this is not thought to be due to gallstones.  3.  Two cm mass seen on CT scan. The patient will need oncology and pulmonary consultation.  At this time, she does not want aggressive measures, but she will have follow up. She has follow up arranged on Wednesday with Dr. Inez Pilgrim.   4.  Tobacco dependence. The patient was encouraged to stop smoking. She does not want to stop smoking at this time. counselled 4 minutes. 5.  History of COPD. The patient was continued on her outpatient inhalers.  DISCHARGE MEDICATIONS:  1.  Vitamin D3 2000 international units daily.  2.  Spiriva 18 milligrams daily.  3.  Lasix 20 mg daily.  4.  Ranitidine 150 b.i.d.  5.  Magnesium oxide 400 daily. 6.  ProAir 1 puff q. 4 hours p.r.n.  7.  Metoprolol 25 mg b.i.d.  8.  Atorvastatin 20 mg daily.   DISCHARGE OXYGEN:  Two liters nasal cannula.   DISCHARGE HONE HEALTH:  With physical therapy and nurse.   DISCHARGE FOLLOWUP:  The patient will follow up Dr. Inez Pilgrim on Wednesday as well as Dr. Raul Del in 1 week.  The patient will follow up with Dr. Ubaldo Glassing in 1 week as well.  TIME SPENT:  45 minutes.  DISCHARGE CONDITION:  Patient is stable for discharge.   ____________________________ Charle Clear P. Benjie Karvonen, MD spm:sp D: 05/05/2014 11:58:00 ET T: 05/05/2014 12:29:22 ET JOB#: [id number]  cc: Kanaya Gunnarson P. Benjie Karvonen, MD, <Dictator> Simonne Come. Inez Pilgrim, MD Herbon E. Raul Del, MD   Donell Beers Marte Celani MD ELECTRONICALLY SIGNED 05/06/2014 12:49

## 2014-06-01 NOTE — Discharge Summary (Signed)
PATIENT NAME:  Kara Clark, Kara Clark MR#:  [id number] DATE OF BIRTH:  1934/07/01  DATE OF ADMISSION:  05/05/2014 DATE OF DISCHARGE:  05/05/2014  ADMISSION DIAGNOSES: 1.  Elevated troponins.  2.  Nausea and vomiting with abdominal pain.  DISCHARGE DIAGNOSES:  1.  Nausea and vomiting with abdominal pain, likely gastroenteritis. This has since resolved.  2.  Elevated troponin due to demand ischemia.  3.  History of coronary artery disease.  4.  Newly found lung mass on CT scan.   CONSULTATIONS:  1.  Christopher A. Lundquist, M.D. 2.  Cardiology.  PERTINENT LABORATORIES: Discharge troponin maximum 0.17. Troponin at discharge 0.15. White blood cells 10, hemoglobin 9.2, hematocrit 29, platelets of 337,000. INR is 1.2.   Sodium 138, potassium 3.8, chloride 103, bicarbonate 26, BUN 13, creatinine 0.94, glucose is 94.   CT of the chest was negative for pulmonary emboli, but there was a 2 cm left upper lobe nodule consistent with bronchogenic carcinoma. No definite metastatic disease seen.   CT of the abdomen and pelvis:  Cholelithiasis, no acute abdominal findings.   HOSPITAL COURSE:  A 80 year old female who presented on April 3 with nausea and vomiting, epigastric abdominal pain, found to incidentally have elevated troponins. For further details, please refer to the H and P.  1.  Elevated troponins.  Dr. Ubaldo Glassing from cardiology is seeing the patient.  Patient did have PTCA in 2001 with a history of CAD.  Her troponins actually have decreased.  Her troponin maximum is 0.17. This is suspected to be mild troponin elevation due to demand ischemia and not acute coronary syndrome. She does have a cardiomyopathy. Had an echocardiogram in March 2016 which showed an EF of 40% with global hypokinesis. Will continue with metoprolol, statin, no aspirin as the patient has a history of anaphylaxis. There could be consideration of Plavix should there be significant wall motion abnormalities on repeat echocardiogram. At  this time, the patient does not have acute coronary syndrome and she will follow up with cardiology.  2.  Epigastric abdominal pain which is likely from gastroenteritis as her nausea and vomiting has resolved. Dr. Rexene Edison had seen the patient in consultation regarding the fact that she had gallstones on her CT scan, but at this time this is not thought to be due to gallstones.  3.  Two cm mass seen on CT scan. The patient will need oncology and pulmonary consultation.  At this time, she does not want aggressive measures, but she will have follow up. She has follow up arranged on Wednesday with Dr. Inez Pilgrim.   4.  Tobacco dependence. The patient was encouraged to stop smoking. She does not want to stop smoking at this time. counselled 4 minutes. 5.  History of COPD. The patient was continued on her outpatient inhalers.  DISCHARGE MEDICATIONS:  1.  Vitamin D3 2000 international units daily.  2.  Spiriva 18 milligrams daily.  3.  Lasix 20 mg daily.  4.  Ranitidine 150 b.i.d.  5.  Magnesium oxide 400 daily. 6.  ProAir 1 puff q. 4 hours p.r.n.  7.  Metoprolol 25 mg b.i.d.  8.  Atorvastatin 20 mg daily.   DISCHARGE OXYGEN:  Two liters nasal cannula.   DISCHARGE HONE HEALTH:  With physical therapy and nurse.   DISCHARGE FOLLOWUP:  The patient will follow up Dr. Inez Pilgrim on Wednesday as well as Dr. Raul Del in 1 week.  The patient will follow up with Dr. Ubaldo Glassing in 1 week as well.  TIME SPENT:  45 minutes.  DISCHARGE CONDITION:  Patient is stable for discharge.   ____________________________ Vernor Monnig P. Benjie Karvonen, MD spm:sp D: 05/05/2014 11:58:00 ET T: 05/05/2014 12:29:22 ET JOB#: [id number]  cc: Tiago Humphrey P. Benjie Karvonen, MD, <Dictator> Simonne Come. Inez Pilgrim, MD Herbon E. Raul Del, MD   Donell Beers Cire Clute MD ELECTRONICALLY SIGNED 05/06/2014 12:49

## 2014-06-01 NOTE — H&P (Signed)
PATIENT NAME:  Kara Clark, Kara Clark MR#:  [id number] DATE OF BIRTH:  Jan 02, 1935  DATE OF ADMISSION:  05/05/2014  The patient was seen before 12 midnight on 05/04/2014.   REFERRING DOCTOR: Eryka A. Edd Fabian, MD  PRIMARY CARE PRACTITIONER: Christena Flake. Raechel Ache, MD  PRIMARY CARDIOLOGIST: Javier Docker. Ubaldo Glassing, MD  PRIMARY PULMONOLOGIST: [place] Raul Del, MD  PRIMARY ONCOLOGIST: Simonne Come. Inez Pilgrim, MD   ADMITTING DOCTOR: Juluis Mire, MD   CHIEF COMPLAINT: Nausea, vomiting with associated epigastric/upper abdominal pain since early morning yesterday.   HISTORY OF PRESENT ILLNESS: A 80 year old Caucasian female with  history of coronary artery disease status post PTCA in 2001, COPD on home oxygen, congestive heart failure, hyperlipidemia, chronic anemia secondary to GI bleed, history of cervical cancer status post hysterectomy in the remote past, active smoker, presents to the Emergency Room with the complaints of ongoing nausea, vomiting with associated upper abdominal pain which started early hours of Saturday. Hence, came to the Emergency Room for further evaluation. The patient states she was doing well before the onset of the symptoms and ever since the symptoms she had multiple episodes of vomiting with no blood in the vomitus. No associated diarrhea. She denies any chest pain. In the Emergency Room, the patient was evaluated by the ED physician and was noted to have  troponin with mild elevation of 0.14 and EKG showing normal sinus rhythm and  T-wave inversions suggestive of ischemia.  The patient also underwent a CT, chest x-ray, and a CT angiogram of the chest and abdomen which revealed bilateral lung nodules worrisome for bronchogenic carcinoma, and CT of the abdomen also revealed cholelithiasis without any evidence of cholecystitis. In view of this elevated troponin in the setting of new EKG changes suggestive of ischemia and pulmonary nodules worrisome for bronchogenic carcinoma, hospitalist service was  consulted for further management. The patient received  IV pain medications and IV antiemetics in the Emergency Room and currently is pain free, and she does not have any episodes of emesis. As noted earlier, the patient denies any chest pain. She does have some chronic shortness of breath, but not acute. She does have some chronic cough, but no sputum, no hemoptysis. She denies any urinary symptoms. No focal weakness or numbness. No fever. ED physician also consulted cardiology on-call, Dr. Humphrey Rolls, regarding the EKG changes and elevated troponin, and as per the ED physician's note, the patient does not meet the criteria for STEMI. A surgical consultation was also requested by the ED physician in view of upper abdominal pain with cholelithiasis, which is pending at this time.   PAST MEDICAL HISTORY:  1.  Coronary artery disease status post PTCA in 2001.  2.  History of congestive heart failure, ejection fraction not known.  3.  COPD on home oxygen.  4.  Hyperlipidemia.  5.  Chronic anemia secondary to GI bleed.  6.  History of remote cervical cancer.  7.  History of pulmonary nodules known to oncologist. Patient being followed by oncology.   PAST SURGICAL HISTORY: 1.  Cataract surgery.  2.  Hysterectomy with right oophorectomy in 1968.   ALLERGIES: LIDODERM, ASPIRIN.   FAMILY HISTORY: Both father and mother deceased because of complications from heart disease.   SOCIAL HISTORY: She is single, lives alone by herself at home. History of smoking about one-third to half pack per day. Denies any history of alcohol or substance abuse.   HOME MEDICATIONS: 1.  Furosemide 20 mg 1 tablet orally once a day.  2.  Magnesium oxide 400 mg 1 tablet orally 2 times a day.  3.  ProAir HFA 1 puff every 4 hours as needed for shortness of breath.  4.  Ranitidine 150 mg 1 tablet orally 2 times a day.  5.  Spiriva 18 mcg inhalation capsule, 1 capsule inhalation once a day.  6.  Vitamin D3, 2000 international units  oral tablet, 1 tablet orally once a day.   REVIEW OF SYSTEMS:  CONSTITUTIONAL: Negative for fever, chills. No fatigue. No generalized weakness.  EYES: Negative for blurred vision, double vision. No pain. No drainage. No discharge.  EARS, NOSE, AND THROAT: Negative for tinnitus, ear pain, hearing loss, epistaxis, nasal discharge.  RESPIRATORY: Positive for chronic cough, but no increased cough. No wheezing. No shortness of breath. No hemoptysis. No painful respirations.  CARDIOVASCULAR: Negative for chest pain, palpitations, dizziness, syncopal episodes, orthopnea, dyspnea on exertion, or pedal edema.  GASTROINTESTINAL: Positive for nausea, vomiting, and upper abdominal pain ongoing for the past 1 day. No hematemesis. No melena.  GENITOURINARY: Negative for dysuria, frequency, urgency, or hematuria.  ENDOCRINE: Negative for polyuria, nocturia, heat or cold intolerance.  HEMATOLOGIC AND LYMPHATIC: Positive for chronic anemia. No bleeding, bruising, or swollen glands.  INTEGUMENTARY: Negative for acne, skin rash, or lesions.  MUSCULOSKELETAL: Negative for neck or back pain. No history of arthritis or gout.  NEUROLOGICAL: Negative for focal weakness or numbness. No history of CVA, TIA, or seizure disorder.  PSYCHIATRIC: Negative for anxiety, insomnia, or depression.   PHYSICAL EXAMINATION:  VITAL SIGNS: Temperature 98.2 degrees Fahrenheit, pulse rate 96 per minute, respirations 20 per minute, blood pressure 122/68, oxygen saturation 99% on 2 L oxygen.  GENERAL: Well developed, well nourished, alert, in no acute distress, comfortably resting in the bed.  HEAD: Atraumatic, normocephalic.  EYES: Pupils equal, react to light and accommodation. No conjunctival pallor. No icterus. Extraocular movements intact.  NOSE: No drainage. No lesions.  EARS: No drainage. No external lesions.  ORAL CAVITY: No mucosal lesions.  NECK: Supple. No JVD. No thyromegaly. No carotid bruit. Range of motion of neck within  normal limits.  RESPIRATORY: Good respiratory effort. Not using accessory muscles of respiration. Bilateral air entry present. Occasional rhonchi. No rales.  CARDIOVASCULAR: S1, S2 regular. No murmurs, gallops, or clicks. Pulses equal at carotid, femoral, pedal pulses. No peripheral edema.  GASTROINTESTINAL: Abdomen soft, diffuse mild tenderness in the upper abdomen. No rigidity. No guarding. Bowel sounds present and equal in all 4 quadrants. No hepatosplenomegaly.  GENITOURINARY: Deferred.  MUSCULOSKELETAL: No joint tenderness or effusion. Range of motion is adequate. Strength and tone equal bilaterally.  SKIN: Inspection within normal limits.  LYMPHATIC: No cervical lymphadenopathy.  VASCULAR: Good dorsalis pedis and posterior tibial pulses.  NEUROLOGICAL: Alert, awake, and oriented x3. Cranial nerves II-XII grossly intact. No sensory deficit. Motor strength 5/5 in both upper and lower extremities. DTRs 2+ bilaterally and symmetrical.  PSYCHIATRIC: Alert, awake, and oriented x 3. Judgment and insight adequate. Memory and mood within normal limits.   ANCILLARY DATA:  LABORATORY DATA: Serum glucose 124, BUN 14, creatinine 1.02, sodium 138, potassium 3.9, chloride 101, bicarbonate 26, total calcium 9.4, lipase 54, total protein 7.8, albumin 4.8, total bilirubin 1.0, alkaline phosphatase 92, AST 16, ALT 10. Troponin 0.14. WBC 14.1, hematocrit 34.0, hemoglobin 10.8, platelet count 377,000. Urinalysis: Nitrite negative, leukocyte esterase negative, WBC 1, bacteria trace.   IMAGING STUDIES:  CHEST X-RAY:  1.  Irregular left perihilar nodule highly worrisome for bronchogenic carcinoma.  2.  Cardiomegaly and chronic obstructive  pulmonary disease. No definite acute findings.  CT ANGIOGRAM OF THE CHEST: 1.  A 2 cm left upper lobe nodule consistent with bronchogenic carcinoma. No definitive metastatic disease.  2.  A 1 cm subpleural nodule in the right lower lobe, possible synchronous tumor.  3.  No  evidence of pulmonary embolism. 4.  Multisegmental bronchial obstruction/mucoid impaction.  5.  No acute abdominal findings. 6.  Cholelithiasis.  7.  Extensive colonic diverticulosis.  8.  Emphysema.   CT OF THE ABDOMEN AND THE PELVIS: 1.  A 2 cm left upper lobe nodule consistent with bronchogenic carcinoma.  2.  A 1 cm subpleural nodule in the right lower lobe, possible synchronous tumor.  3.  No evidence of pulmonary embolism. 4.  Multisegment bronchial obstruction/mucoid impaction.  5.  No acute abdominal findings. 6.  Cholelithiasis. 7.  Extensive colonic diverticulosis.  8.  Emphysema.   BILIARY: No evidence of obstruction or inflammation.   EKG: Normal sinus rhythm with ventricular rate of 90 beats per minute, right bundle branch block, left anterior fascicular block, T-wave inversions in inferolateral leads.   ASSESSMENT AND PLAN: A 80 year old Caucasian female with a history of coronary artery disease status post percutaneous transluminal coronary angioplasty, chronic obstructive pulmonary disease on home oxygen, congestive heart failure, hyperlipidemia, history of pulmonary nodules of unclear etiology under the care of hematologist, who presents with ongoing nausea, vomiting, epigastric pain of 1-day duration, found to have mildly elevated troponin with new EKG changes with T-wave inversions concerning for ischemia. CT angiogram of the chest with bilateral lung nodules worrisome for bronchogenic carcinoma. Cholelithiasis with no evidence of cholecystitis.  1.  Elevated troponin with EKG changes suggestive of ischemia. Non-ST-elevation myocardial infarction versus demand ischemia. Plan: Admit to telemetry. Nitroglycerin p.r.n., beta blocker, heparin drip. No aspirin because of history of anaphylaxis. Statin. Echocardiogram and cardiology consultation requested for further evaluation.  2.  Nausea, vomiting with associated upper abdominal pain ongoing for the past 1 day. CT of abdomen  shows cholelithiasis with no evidence of cholecystitis. Likely gastritis versus rule out cholecystitis. Surgical consultation requested by the Emergency Department physician, which is pending at this time. Further workup as per surgical advice. Monitor for now. We will keep her n.p.o. on intravenous antiemetics with intravenous pain medications.  3.  Lung mass on CTA of the chest, worrisome for bronchogenic carcinoma. History of pulmonary nodules in the past being followed by oncologist, but not characterized before. The patient needs followup. Will request hematology oncology consultation for further advice.  4.  Coronary artery disease status post percutaneous transluminal coronary angioplasty in 2001. Known to cardiologist. 5.  Chronic obstructive pulmonary disease on home oxygen. No acute exacerbation. The patient known to Dr. Raul Del. We will request pulmonary consultation because of pulmonary nodules and multisegmental bronchial obstruction/mucoid impaction.  6.  Chronic anemia with history of gastrointestinal bleed in the past. No acute bleeding. Hemoglobin and hematocrit mildly low but stable. Monitor.  7.  Active smoker. Counseled to quit. The patient not motivated.  8.  Deep vein thrombosis prophylaxis. Heparin drip.  9.  Gastrointestinal prophylaxis. Proton pump inhibitor.   CODE STATUS: DNR, DNI which is confirmed with the patient as well as the patient's daughter, who is with the patient at this time.  Benefits and possible side effects of anticoagulation discussed with the patient and the patient's daughter. They state they understand. They agree with the plan.   TIME SPENT: 50 minutes.   ____________________________ Juluis Mire, MD enr:ST D: 05/05/2014 00:57:28  ET T: 05/05/2014 02:02:16 ET JOB#: [id number]  cc: Juluis Mire, MD, <Dictator> Christena Flake. Raechel Ache, MD Javier Docker Ubaldo Glassing, MD Herbon E. Raul Del, MD Simonne Come Inez Pilgrim, MD Juluis Mire MD ELECTRONICALLY SIGNED  05/05/2014 18:44

## 2014-06-01 NOTE — Consult Note (Signed)
Present Illness Patient is a 80 year old female with history of coronary artery disease status post PTCA in a in 2001, history of lying iron deficiency anemia, hypertension, hyperlipidemia and anemia,chronic kidney disease grade 3, with history of pulmonary nodules noted on chest CT scan.  She was admitted to [facility] after presenting with nausea and vomiting.  In the emergency room.  She was noted to have a mildly elevated serum troponin of 0.14.  Patient in has a history of iron deficiency anemia.  She had been on dual anti-platelet therapy in the past.  She was admitted with nausea and vomiting.  Chest CT revealed pulmonary nodules.  There was also noted cholelithiasis but no obvious cholecystitis.  Her electrocardiogram reveals sinus rhythm with right bundle-branch block and left anterior fascicular block which is unchanged from previous EKG.  She had no chest pain on pre sensation other than pain radiating from her abdomen up through her midsternal area with nausea and vomiting.  She is currently pain free.  Troponin has not increased.  Her chest discomfort is different than the angina she had when she had her PTCA.  Her nausea vomiting has improved and consultations with General surgery and pulmonary are pending   Physical Exam:  GEN no acute distress   HEENT PERRL   NECK supple   RESP normal resp effort  clear BS   CARD Regular rate and rhythm  Normal, S1, S2  Murmur   Murmur Systolic   Systolic Murmur axilla   ABD denies tenderness  normal BS  no Adominal Mass   LYMPH negative neck, negative axillae   EXTR negative cyanosis/clubbing   SKIN normal to palpation   NEURO cranial nerves intact, motor/sensory function intact   PSYCH A+O to time, place, person   Review of Systems:  Subjective/Chief Complaint nausea and vomiting   General: No Complaints   Skin: No Complaints   ENT: No Complaints   Eyes: No Complaints   Neck: No Complaints   Respiratory: No Complaints    Cardiovascular: some epigastric discomfort   Gastrointestinal: Heartburn  Nausea   Genitourinary: No Complaints   Vascular: No Complaints   Musculoskeletal: No Complaints   Neurologic: No Complaints   Hematologic: No Complaints   Endocrine: No Complaints   Psychiatric: No Complaints   Review of Systems: All other systems were reviewed and found to be negative   Medications/Allergies Reviewed Medications/Allergies reviewed   Family & Social History:  Family and Social History:  Family History Non-Contributory   EKG:  EKG NSR   Abnormal RBBB  Left axis   Interpretation no ischemic changes    Aspirin: Other  Lidoderm: Rash   Impression 80 year old female with history of coronary artery disease status post PTCA in 2001, history of COPD, history of iron deficiency anemia admitted with several days of nausea and vomiting.  She was noted on chest CT to have pulmonary nodules.  She is being seen by Oncology.  She was also noted to have cholelithiasis with no obvious cholecystitis.  She is being seen by General surgery.  She has a history of COPD and Pulmonary consultation is pending.  She had a mild serum troponin elevation at 0.14 on admission.  She had no chest pain but did have some epigastric discomfort with the nausea and when she was vomiting.  His symptoms have resolved with resolution of her nausea.  patient had an echocardiogram in early March, 2016 which revealed ejection fraction of 40% with global hypokinesis.  She had  mild MR and TR at that time.  Etiology of her elevated troponin is unclear.  Patient does have a history of previous coronary disease although her symptoms were atypical for her angina.  Demand ischemia may be playing a role.  Echocardiogram is pending.  Will compare that echocardiogram with 1 done previously approximately 1 month ago.  If there is significant wall motion abnormality difference, would consider invasive evaluation.  If this is unremarkable,  would continue with medical therapy with probable demand ischemia.  Agree with General surgery and Pulmonary evaluations.   Plan 1. Review echocardiogram when available 2. Continue with metoprolol 25 tartrate b.i.d. and atorvastatin 20 mg daily 3. would continue to hold aspirin secondary to history of anaphylaxis with this.  Would consider clopidogrel should there be significant wall motion abnormality noted on echo in comparison to previous echo 4. ambulate as tolerated 5. Further recommendations pending course and echo results   Electronic Signatures: Teodoro Spray (MD)  (Signed 04-Apr-16 09:34)  Authored: General Aspect/Present Illness, History and Physical Exam, Review of System, Family & Social History, EKG , Allergies, Impression/Plan   Last Updated: 04-Apr-16 09:34 by Teodoro Spray (MD)

## 2014-06-06 DIAGNOSIS — D638 Anemia in other chronic diseases classified elsewhere: Secondary | ICD-10-CM

## 2014-06-09 ENCOUNTER — Inpatient Hospital Stay: Attending: Internal Medicine

## 2014-06-09 ENCOUNTER — Inpatient Hospital Stay

## 2014-06-09 ENCOUNTER — Inpatient Hospital Stay (HOSPITAL_BASED_OUTPATIENT_CLINIC_OR_DEPARTMENT_OTHER): Admitting: Internal Medicine

## 2014-06-09 VITALS — BP 111/63 | HR 56 | Temp 97.4°F | Resp 16 | Wt 108.7 lb

## 2014-06-09 DIAGNOSIS — R079 Chest pain, unspecified: Secondary | ICD-10-CM

## 2014-06-09 DIAGNOSIS — R627 Adult failure to thrive: Secondary | ICD-10-CM | POA: Diagnosis not present

## 2014-06-09 DIAGNOSIS — D649 Anemia, unspecified: Secondary | ICD-10-CM | POA: Diagnosis not present

## 2014-06-09 DIAGNOSIS — R270 Ataxia, unspecified: Secondary | ICD-10-CM | POA: Diagnosis not present

## 2014-06-09 DIAGNOSIS — M549 Dorsalgia, unspecified: Secondary | ICD-10-CM | POA: Insufficient documentation

## 2014-06-09 DIAGNOSIS — D638 Anemia in other chronic diseases classified elsewhere: Secondary | ICD-10-CM

## 2014-06-09 DIAGNOSIS — D721 Eosinophilia: Secondary | ICD-10-CM

## 2014-06-09 DIAGNOSIS — I2510 Atherosclerotic heart disease of native coronary artery without angina pectoris: Secondary | ICD-10-CM

## 2014-06-09 DIAGNOSIS — J449 Chronic obstructive pulmonary disease, unspecified: Secondary | ICD-10-CM

## 2014-06-09 LAB — SAMPLE TO BLOOD BANK

## 2014-06-09 LAB — CBC WITH DIFFERENTIAL/PLATELET
Basophils Absolute: 0.1 10*3/uL (ref 0–0.1)
Basophils Relative: 1 %
EOS PCT: 3 %
Eosinophils Absolute: 0.3 10*3/uL (ref 0–0.7)
HCT: 27.8 % — ABNORMAL LOW (ref 35.0–47.0)
Hemoglobin: 8.9 g/dL — ABNORMAL LOW (ref 12.0–16.0)
LYMPHS PCT: 21 %
Lymphs Abs: 2.9 10*3/uL (ref 1.0–3.6)
MCH: 33.9 pg (ref 26.0–34.0)
MCHC: 32 g/dL (ref 32.0–36.0)
MCV: 106 fL — ABNORMAL HIGH (ref 80.0–100.0)
Monocytes Absolute: 0.9 10*3/uL (ref 0.2–0.9)
Monocytes Relative: 6 %
NEUTROS PCT: 69 %
Neutro Abs: 9.4 10*3/uL — ABNORMAL HIGH (ref 1.4–6.5)
PLATELETS: 417 10*3/uL (ref 150–440)
RBC: 2.63 MIL/uL — ABNORMAL LOW (ref 3.80–5.20)
RDW: 20.2 % — ABNORMAL HIGH (ref 11.5–14.5)
WBC: 13.6 10*3/uL — ABNORMAL HIGH (ref 3.6–11.0)

## 2014-06-10 ENCOUNTER — Emergency Department

## 2014-06-10 ENCOUNTER — Emergency Department
Admission: EM | Admit: 2014-06-10 | Discharge: 2014-06-10 | Disposition: A | Discharge status: Home / Self Care | Attending: Internal Medicine | Admitting: Internal Medicine

## 2014-06-10 ENCOUNTER — Encounter: Payer: Self-pay | Admitting: Medical Oncology

## 2014-06-10 ENCOUNTER — Other Ambulatory Visit: Payer: Self-pay

## 2014-06-10 DIAGNOSIS — R42 Dizziness and giddiness: Secondary | ICD-10-CM | POA: Insufficient documentation

## 2014-06-10 DIAGNOSIS — R0602 Shortness of breath: Secondary | ICD-10-CM

## 2014-06-10 DIAGNOSIS — Z9981 Dependence on supplemental oxygen: Secondary | ICD-10-CM | POA: Diagnosis not present

## 2014-06-10 DIAGNOSIS — R260 Ataxic gait: Secondary | ICD-10-CM | POA: Diagnosis not present

## 2014-06-10 DIAGNOSIS — D649 Anemia, unspecified: Secondary | ICD-10-CM | POA: Diagnosis present

## 2014-06-10 DIAGNOSIS — C3492 Malignant neoplasm of unspecified part of left bronchus or lung: Secondary | ICD-10-CM | POA: Insufficient documentation

## 2014-06-10 DIAGNOSIS — N183 Chronic kidney disease, stage 3 (moderate): Secondary | ICD-10-CM | POA: Insufficient documentation

## 2014-06-10 DIAGNOSIS — R531 Weakness: Secondary | ICD-10-CM | POA: Diagnosis not present

## 2014-06-10 DIAGNOSIS — Z720 Tobacco use: Secondary | ICD-10-CM | POA: Insufficient documentation

## 2014-06-10 DIAGNOSIS — Z79899 Other long term (current) drug therapy: Secondary | ICD-10-CM | POA: Insufficient documentation

## 2014-06-10 LAB — CBC WITH DIFFERENTIAL/PLATELET
Basophils Absolute: 0.1 10*3/uL (ref 0–0.1)
Basophils Relative: 1 %
EOS ABS: 0.8 10*3/uL — AB (ref 0–0.7)
Eosinophils Relative: 7 %
HCT: 27.7 % — ABNORMAL LOW (ref 35.0–47.0)
Hemoglobin: 8.8 g/dL — ABNORMAL LOW (ref 12.0–16.0)
Lymphocytes Relative: 18 %
Lymphs Abs: 2 10*3/uL (ref 1.0–3.6)
MCH: 35 pg — AB (ref 26.0–34.0)
MCHC: 31.8 g/dL — AB (ref 32.0–36.0)
MCV: 109.8 fL — AB (ref 80.0–100.0)
MONO ABS: 0.8 10*3/uL (ref 0.2–0.9)
MONOS PCT: 8 %
Neutro Abs: 7 10*3/uL — ABNORMAL HIGH (ref 1.4–6.5)
Neutrophils Relative %: 66 %
PLATELETS: 372 10*3/uL (ref 150–440)
RBC: 2.53 MIL/uL — ABNORMAL LOW (ref 3.80–5.20)
RDW: 20.7 % — ABNORMAL HIGH (ref 11.5–14.5)
WBC: 10.7 10*3/uL (ref 3.6–11.0)

## 2014-06-10 LAB — COMPREHENSIVE METABOLIC PANEL
ALK PHOS: 90 U/L (ref 38–126)
ALT: 6 U/L — ABNORMAL LOW (ref 14–54)
ANION GAP: 9 (ref 5–15)
AST: 11 U/L — ABNORMAL LOW (ref 15–41)
Albumin: 4 g/dL (ref 3.5–5.0)
BILIRUBIN TOTAL: 0.8 mg/dL (ref 0.3–1.2)
BUN: 17 mg/dL (ref 6–20)
CHLORIDE: 103 mmol/L (ref 101–111)
CO2: 26 mmol/L (ref 22–32)
Calcium: 8.7 mg/dL — ABNORMAL LOW (ref 8.9–10.3)
Creatinine, Ser: 1.03 mg/dL — ABNORMAL HIGH (ref 0.44–1.00)
GFR calc non Af Amer: 51 mL/min — ABNORMAL LOW (ref >60)
GFR, EST AFRICAN AMERICAN: 59 mL/min — AB (ref >60)
Glucose, Bld: 95 mg/dL (ref 65–99)
Potassium: 4 mmol/L (ref 3.5–5.1)
SODIUM: 138 mmol/L (ref 135–145)
Total Protein: 7.2 g/dL (ref 6.5–8.1)

## 2014-06-10 LAB — TYPE AND SCREEN
ABO/RH(D): O POS
Antibody Screen: NEGATIVE

## 2014-06-10 LAB — ABO/RH: ABO/RH(D): O POS

## 2014-06-10 MED ORDER — MECLIZINE HCL 25 MG PO TABS
ORAL_TABLET | ORAL | Status: AC
  Filled 2014-06-10: qty 1

## 2014-06-10 MED ORDER — MECLIZINE HCL 25 MG PO TABS
25.0000 mg | ORAL_TABLET | Freq: Once | ORAL | Status: AC
  Administered 2014-06-10: 25 mg via ORAL

## 2014-06-10 MED ORDER — MECLIZINE HCL 25 MG PO TABS: 25.0000 mg | ORAL_TABLET | Freq: Three times a day (TID) | ORAL | Status: DC | PRN

## 2014-06-10 NOTE — ED Notes (Signed)
Pt went to [facility] doctor yesterday and had labs drawn, was told to come to ER yesterday for low blood levels, could not come last night. Pt reports she was recently diagnosed with lung CA and hospice care was called. Pt c/o feeling weak.

## 2014-06-10 NOTE — ED Notes (Signed)
Pt here with c/o weakness and low Hgb.  Pt current smoker x 65 years.  Recent Dx of lung CA and was at Cancer center yesterday for lab work.

## 2014-06-10 NOTE — ED Provider Notes (Signed)
[facility] Emergency Department Provider Note  ____________________________________________  Time seen: Approximately 4:07 PM  I have reviewed the triage vital signs and the nursing notes.   HISTORY  Chief Complaint Anemia  Weakness dizziness and staggering gait  HPI Kara Clark is a 80 y.o. female who presented to the emergency department with chief complaint of weakness dizziness and staggering gait of several days' duration. Patient states "I'm staggering like a drunk person and I don't drink".  She was seen by her oncologist Dr. Inez Pilgrim yesterday and had laboratory evaluation which revealed a hemoglobin of 8.9. She was instructed to come to the emergency department for further evaluation of her generalized weakness dizziness and difficulty with gait. sHe denies any falls.  Patient has a past medical history of lung cancer which she has declined treatment for. Dr. Franciso Bend is just following her for supportive care. Dr. Dorthula Perfect is her primary care doctor and noted that she had been dwindling for the past several weeks. He called in hospitalist weeks ago and the patient has been followed on a daily basis by hospice. She lives independently and uses a walker to walk. Over the past several days she has noted this more difficulty with walking and functioning with her activities of daily living alone at home.  She has a baseline shortness of breath which is not worse than usual she uses home O2 and continues to smoke.    Past Medical History  Diagnosis Date  . History of GI bleed 2010  . CAD (coronary artery disease)   . Cervical cancer   . CHF (congestive heart failure)   . COPD (chronic obstructive pulmonary disease)   . Hyperlipidemia   . Lung mass     68m pulmonary nodule  . Eosinophilia     prior BCR neg, FISH for PDGF neg  . History of hemoptysis   . Hydronephrosis of right kidney 09/2011  . B12 deficiency anemia   . Chronic anemia   . History of  pneumonia 12/2013  . History of MI (myocardial infarction) 12/2012  . History of respiratory failure 12/2012  . Duodenitis   . History of ataxia     Patient Active Problem List   Diagnosis Date Noted  . DNAR (do not attempt resuscitation) 05/28/2014  . Lung mass 05/28/2014  . Gastric catarrh 01/08/2014  . 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 A-fib 07/30/2013  . Absolute anemia 06/25/2013  . Cramp in muscle 06/25/2013  . Chronic kidney disease (CKD), stage III (moderate) 06/24/2013  . COPD, moderate 06/24/2013  . CAD in native artery 06/24/2013  . Eosinophil count raised 06/24/2013  . Benign essential HTN 06/24/2013  . HLD (hyperlipidemia) 06/24/2013  . Avitaminosis D 06/24/2013    Past Surgical History  Procedure Laterality Date  . Athrectomy directional coronary  03/22/2008  . Coronary angioplasty with stent placement  03/22/2008  . Cataract extraction  2009  . Right oophorectomy  1968  . Total abdominal hysterectomy  1968  . Esophagogastroduodenoscopy  10/2013  . Colonoscopy  10/2013    Current Outpatient Rx  Name  Route  Sig  Dispense  Refill  . acetaminophen (TYLENOL) 500 MG tablet   Oral   Take 1,000 mg by mouth every 4 (four) hours as needed for mild pain.          .Marland Kitchenalbuterol (PROVENTIL HFA;VENTOLIN HFA) 108 (90 BASE) MCG/ACT inhaler   Inhalation   Inhale 1 puff into the lungs every 4 (four) hours as  needed for wheezing or shortness of breath.         . Cholecalciferol (VITAMIN D3) 2000 UNITS TABS   Oral   Take 1 tablet by mouth daily.         . magnesium oxide (MAG-OX) 400 MG tablet   Oral   Take 400 mg by mouth daily.          . nitroGLYCERIN (NITROSTAT) 0.4 MG SL tablet   Sublingual   Place 0.4 mg under the tongue every 5 (five) minutes as needed for chest pain.          . ranitidine (ZANTAC) 150 MG tablet   Oral   Take 150 mg by mouth 2 (two) times daily.          . roflumilast (DALIRESP) 500 MCG TABS tablet   Oral   Take 500 mcg by  mouth daily.          . meclizine (ANTIVERT) 25 MG tablet   Oral   Take 1 tablet (25 mg total) by mouth 3 (three) times daily as needed for dizziness or nausea.   30 tablet   1     Allergies Metoprolol; Mirtazapine; Nsaids; Simvastatin; Asa; Lidoderm; and Pantoprazole  No family history on file.  Social History History  Substance Use Topics  . Smoking status: Current Every Day Smoker -- 0.50 packs/day    Types: Cigarettes  . Smokeless tobacco: Not on file  . Alcohol Use: No    Review of Systems Constitutional: No fever/chills Eyes: No visual changes. ENT: No sore throat. Cardiovascular: Denies chest pain. Respiratory: Denies shortness of breath. Gastrointestinal: No abdominal pain.  No nausea, no vomiting.  No diarrhea.  No constipation. Genitourinary: Negative for dysuria. Musculoskeletal: Negative for back pain. Skin: Negative for rash. Neurological: Negative for headaches, focal weakness or numbness.  10-point ROS otherwise negative.  ____________________________________________   PHYSICAL EXAM:  VITAL SIGNS: ED Triage Vitals  Enc Vitals Group     BP 06/10/14 1358 122/62 mmHg     Pulse Rate 06/10/14 1358 87     Resp --      Temp 06/10/14 1358 97.6 F (36.4 C)     Temp Source 06/10/14 1358 Oral     SpO2 06/10/14 1358 96 %     Weight 06/10/14 1358 105 lb (47.628 kg)     Height 06/10/14 1358 '5\' 4"'$  (1.626 m)     Head Cir --      Peak Flow --      Pain Score 06/10/14 1359 10     Pain Loc --      Pain Edu? --      Excl. in [place]? --   Initial vital signs in the emergency department and noted above and are all within normal limits she is slightly hypoxic with an O2  of 96% she is on chronic O2 treatment.  Constitutional: Alert and oriented. Thin and in no acute distress. Eyes: Conjunctivae are normal. PERRL. EOMI. Head: Atraumatic. Nose: No congestion/rhinnorhea. Mouth/Throat: Mucous membranes are moist.  Oropharynx non-erythematous. Neck: No stridor.    Cardiovascular: Normal rate, regular rhythm. Grossly normal heart sounds.  Good peripheral circulation. Respiratory: Normal respiratory effort.  No retractions. The breath with conversation. Wearing nasal O2. Gastrointestinal: Soft and nontender. No distention. No abdominal bruits. No CVA tenderness. That Musculoskeletal: No lower extremity tenderness nor edema.  No joint effusions. Neurologic:  Normal speech and language. No gross focal neurologic deficits are appreciated. Speech is normal. No gait instability.  Skin:  Skin is warm, dry and intact. No rash noted. Psychiatric: Mood and affect are normal. Speech and behavior are normal.  ____________________________________________   LABS (all labs ordered are listed, but only abnormal results are displayed)  Labs Reviewed  CBC WITH DIFFERENTIAL/PLATELET - Abnormal; Notable for the following:    RBC 2.53 (*)    Hemoglobin 8.8 (*)    HCT 27.7 (*)    MCV 109.8 (*)    MCH 35.0 (*)    MCHC 31.8 (*)    RDW 20.7 (*)    Neutro Abs 7.0 (*)    Eosinophils Absolute 0.8 (*)    All other components within normal limits  COMPREHENSIVE METABOLIC PANEL - Abnormal; Notable for the following:    Creatinine, Ser 1.03 (*)    Calcium 8.7 (*)    AST 11 (*)    ALT 6 (*)    GFR calc non Af Amer 51 (*)    GFR calc Af Amer 59 (*)    All other components within normal limits  TYPE AND SCREEN  ABO/RH   __ Labs Reviewed  CBC WITH DIFFERENTIAL/PLATELET - Abnormal; Notable for the following:    RBC 2.53 (*)    Hemoglobin 8.8 (*)    HCT 27.7 (*)    MCV 109.8 (*)    MCH 35.0 (*)    MCHC 31.8 (*)    RDW 20.7 (*)    Neutro Abs 7.0 (*)    Eosinophils Absolute 0.8 (*)    All other components within normal limits  COMPREHENSIVE METABOLIC PANEL - Abnormal; Notable for the following:    Creatinine, Ser 1.03 (*)    Calcium 8.7 (*)    AST 11 (*)    ALT 6 (*)    GFR calc non Af Amer 51 (*)    GFR calc Af Amer 59 (*)    All other components within  normal limits  TYPE AND SCREEN  ABO/RH  __  Labs are significant for hemoglobin of 8.8. Hemoglobin was 8.9 yesterday 059. Her hemoglobin was 9.2 on April 4. ________________________________________ Her comprehensive medical panel reveals a low calcium of 8.7 with a normal protein. Her creatinine slightly elevated at 1.03. Her liver function tests are normal.   EKG  ED ECG REPORT   Date: 06/10/2014  EKG Time: 21:38  Rate: 86  Rhythm: normal EKG, normal sinus rhythm, unchanged from previous tracings, RBBB  Axis normal  Intervals:none   septal infarct age unknown.  ST&T Change: Nonspecific ST-T wave changes  ____________________________________________  RADIOLOGY  IMPRESSION: 1. Medial left base opacity suggesting segmental lower lobe collapse. 2. Pulmonary nodule in the left lung noted 05/04/2014 is no longer visible. If decreased/resolved this would suggest an inflammatory nodule rather than carcinoma. Nonemergent chest CT without contrast could clarify. 3. Emphysema. 4. Probable trace effusions.   Electronically Signed By: Monte Fantasia M.D. On: 06/10/2014 18:23         IMPRESSION: 1. Medial left base opacity suggesting segmental lower lobe collapse. 2. Pulmonary nodule in the left lung noted 05/04/2014 is no longer visible. If decreased/resolved this would suggest an inflammatory nodule rather than carcinoma. Nonemergent chest CT without contrast could clarify. 3. Emphysema. 4. Probable trace effusions.   Electronically Signed By: Monte Fantasia M.D. On: 06/10/2014 18:23         IMPRESSION: Chest x-ray  1. Medial left base opacity suggesting segmental lower lobe collapse. 2. Pulmonary nodule in the left lung noted 05/04/2014 is no longer visible. If decreased/resolved  this would suggest an inflammatory nodule rather than carcinoma. Nonemergent chest CT without contrast could clarify. 3. Emphysema. 4. Probable trace  effusions.   Electronically Signed By: Monte Fantasia M.D. On: 06/10/2014 18:23         IMPRESSION:ct head Atrophy and moderate chronic microvascular changes. No acute intracranial findings.   Electronically Signed By: Andreas Newport M.D. On: 06/10/2014 21: ____________________________________________   PROCEDURES  ProcedureNone  Critical Care performed: No  ____________________________________________   INITIAL IMPRESSION / ASSESSMENT AND PLAN / ED COURSE Pertinent labs & imaging results that were available during my care of the patient were reviewed by me and considered in my medical decision making (see chart for details).  This 80 year old female with a history of lung cancer ,declining treatment, and chronic anemia, came to the emergency department because of weakness and dizziness. She was under the impression that her oncology/hematologist Dr. Cynda Acres was sending her because she was anemic and needed a blood transfusion. Her hemoglobin is stable in the emergency department at 8.8 has been 8.9 yesterday and 9.2 on 4/4.   Spoke with Dr. Efraim Kaufmann who is covering for Dr. Evette Cristal and he agrees that she does not require blood transfusion. A head CT was done in the emergency department to evaluate for any other causes of her symptoms.   She was given Antivert in the emergency department with some minimal relief of her symptoms. The CAT scan does not reveal any acute abnormality.  The patient is able to ambulate independently in the emergency department without any ataxia weakness dizziness or unsteady gait. She is able to eat in the ER without any nausea or vomiting. The Antivert improved her symptoms and will be diagnosed on an outpatient basis. Her vital signs remained stable in the ED  She is to follow-up with hospice and her primary care doctor tomorrow. ____________________________________________   FINAL CLINICAL IMPRESSION(S) / ED DIAGNOSES  Final  diagnoses:  Staggering gait  Weakness generalized  Dizziness      Boris Lown, DO 06/10/14 2235

## 2014-06-10 NOTE — Discharge Instructions (Signed)
Take antivert if needed for dizziness. Increase fluids. Follow-up with your hospice nurse and Dr. Dorthula Perfect in the morning. If his symptoms worsen or he develop any new symptoms or no improvement in her present symptoms return to the ER.

## 2014-06-10 NOTE — Progress Notes (Signed)
Chief Complaint/Problem List:    Chronic anemia, copd, cad, eosinophillia lung mass, prior gi bleed, chronic dizziness, ataxia   HPI: Seen in f/u, referd by Dr Dorthula Perfect to reevaluate anemia,prior seen by me for chronic anemia , osinophillia, lung mass. Patient more rcently did not want f/u. She has recently been referred to hospice for copd, cad, chf, Today was comlaining of severe mid and low back and some bilateral rib pain back pain radiating down to coxxyx, gradualncrased onset since last night, wrse with walking, felt btter with sitting. Also has recent frequent retrosternal cp relived by ntg. Also soboe but not at rest. No abdominal pan    Review of Systems:  General: No, fever chills or sweats   HEENT: No headache, dizziness, ear or jaw pain, or epistaxis   Lungs: No cough, shortness of breath, at rest,  HAS SOBOE, No wheezing, No chest pain, No, hemoptysis     GI: no abdominal pain, nausea, vomiting, diahrrea,    GU: no dysuria, no hematuria, no vaginal bleeding             Allergies Allergies  Allergen Reactions  . Metoprolol Rash  . Mirtazapine Nausea Only  . Nsaids Swelling  . Simvastatin Other (See Comments)    Reaction:  Unknown  . Asa [Aspirin] Diarrhea, Nausea And Vomiting and Swelling  . Lidoderm [Lidocaine] Other (See Comments)    Reaction:  Unknown   . Pantoprazole Diarrhea    Significant History/PMH:  SEE PRESENT ILLNESS        Smoking History: Never smoker, Past Smoker,    PFSH: Family History: No family history on file.  Comments:   Social History:  History  Alcohol Use No    Additional Past Medical and Surgical History:    Home Medications:   Vital Signs:  Filed Vitals:   06/09/14 1555  Weight: 108 lb 11 oz (49.3 kg)    Physical Exam:  General:  CACHECTIC, PALE. Looks chronically ill  Mental Status: alert and oriented to person, place and time, ANXIOUS  Head, Ears, Nose,Throat: No thrush  Respiratory: no rales, rhonchi, or  wheezing, no dullness  Cardiovascular: regular rate and rhythm  Gastrointestinal: soft, non tender, no masses or organomegaly  Musculoskeletal: no lower extremity edema no calf tenderness, walks with walker  Skin: no rashes, no bruises  Neurological: No gross focal weakness cranial nerves intact  Lymphatics: Not palpable, neck supraclavicular, submandibular, axilla       Laboratory Results: hgb 8.8       Radiology Results: No images are attached to the encounter.         Assessment and Plan: Impression:  Failure to thrive, anemic but stable, soboe so symptomatic of anemia, also cad and angina, stable pttern relieved by ntg but regular ccurence with exertion, not at rest. Back pain since yesterday, abdo benign, some rib tenerness, no gross tenderness over spinous process,  When walking, sb, pain worse, then anxious, better when rests   Plan:  Did not give procrit, wll not give with thrombosis risk with hx of angina as above. With heart disease and soboe, suggested that prbc transfusion could palliate. Patient did not want to be hospitalized. She is not a candidate for out patient transfusion in cancer center, due to possible cardiac instability.  I discussed f/u plans with patint and hospce nrse. Hospice to visit later and prn. May start low dose oxycodone or morphine prn, or 02 prn but 02 sat was fine at [facility]. For  palliation of angina and weakness and soboe would hosptalize prn for transfusion. Nothing else to offer from cancer center.  No f/u appt given, can see prn as per Dr Dorthula Perfect or hospice if reevaluation

## 2014-06-18 ENCOUNTER — Telehealth: Payer: Self-pay | Admitting: *Deleted

## 2014-06-18 NOTE — Telephone Encounter (Signed)
Pt went to Er as instructed the other day, but they said she did not meet requirements to transfuse. She is not feeling well and is getting B12 inj today. Asking if she should have appt scheduled this week or next for lab and possible transfusion. Please call Lovell Sheehan and pt regarding this message

## 2014-06-18 NOTE — Telephone Encounter (Signed)
Notified Phoebe and the patient that we would not be administering blood in our clinic, due to patient's weak physical condition and cardiac status.

## 2014-07-25 IMAGING — CT CT CHEST W/O CM
1 series · 15 of 32 positions shown, 19 images · non-contrast
Comparison: none

REASON FOR EXAM: Lung nodule follow up Pt has renal failure
COMMENTS:

PROCEDURE:     CT  - CT CHEST WITHOUT CONTRAST  - May 17, 2012 [DATE]
RESULT:     Comparison: 11/17/2011, 06/10/2011
TECHNIQUE: Multiple axial images of the chest were obtained without
intravenous contrast.

[Series 2: soft tissue · axial · 0.64mm/px · z∈[-598,-330]mm · 15 of 100 slices shown, 19 images]
[im 7/100  soft-tissue]
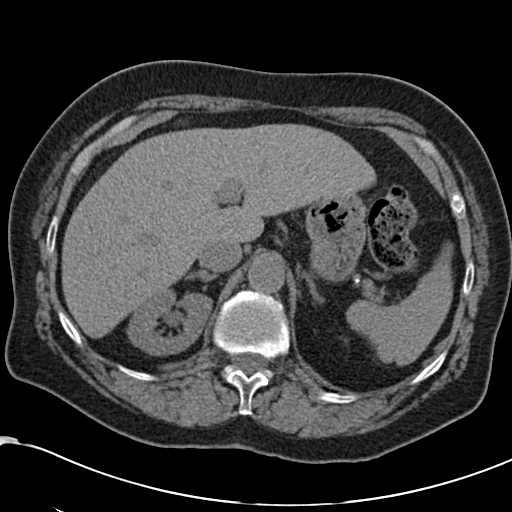
[im 7/100  bone]
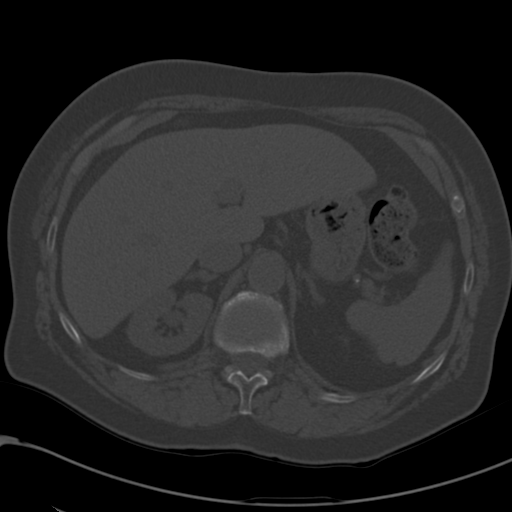
[im 13/100  soft-tissue]
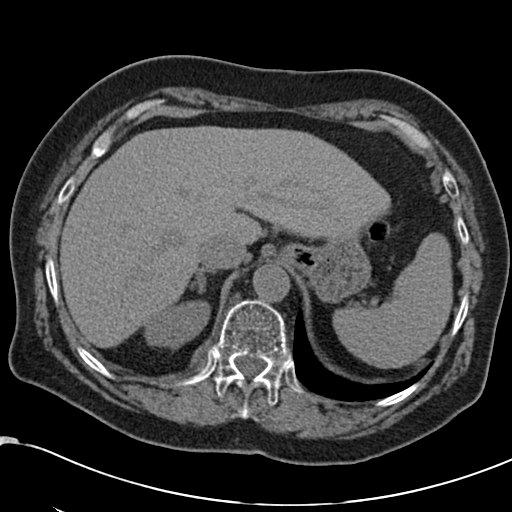
[im 20/100  soft-tissue]
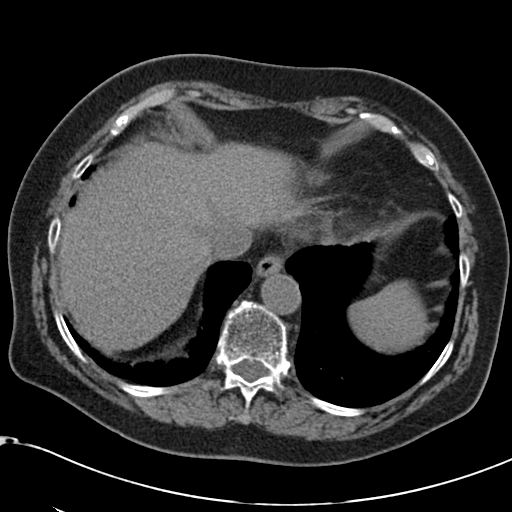
[im 29/100  soft-tissue]
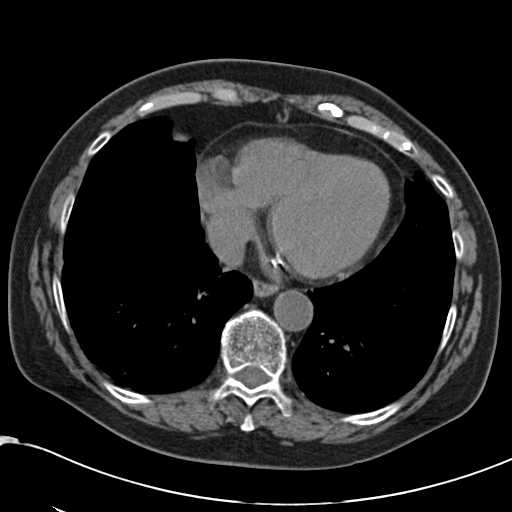
[im 36/100  soft-tissue]
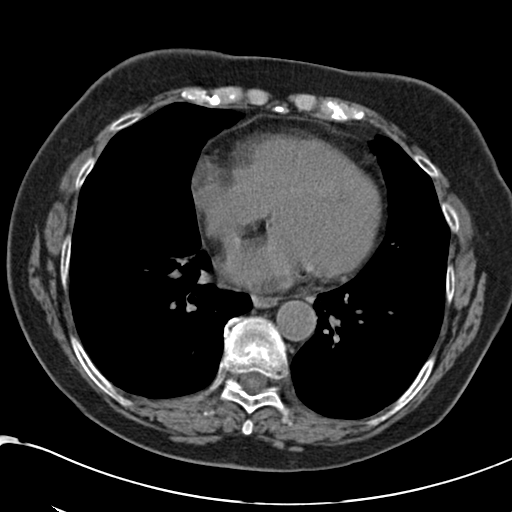
[im 42/100  soft-tissue]
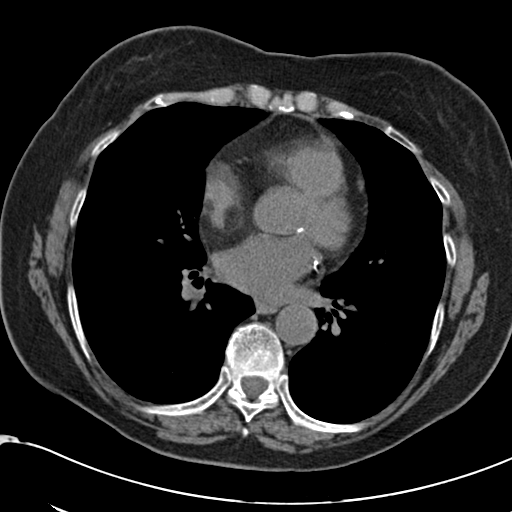
[im 52/100  soft-tissue]
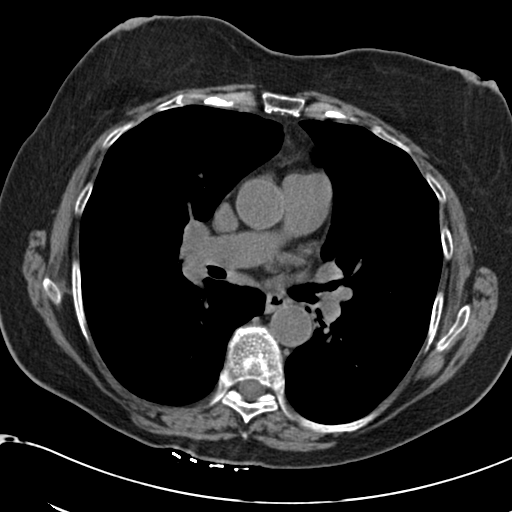
[im 58/100  soft-tissue]
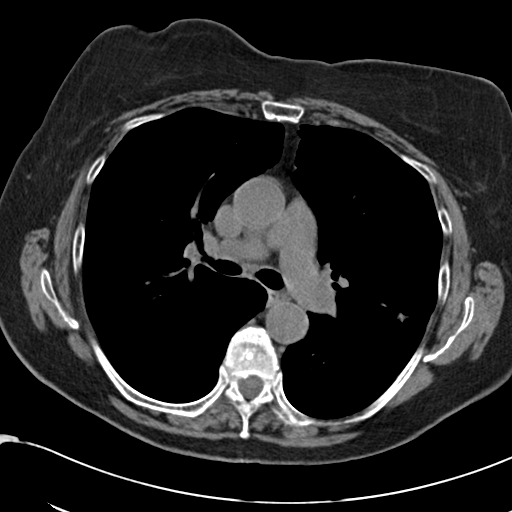
[im 64/100  soft-tissue]
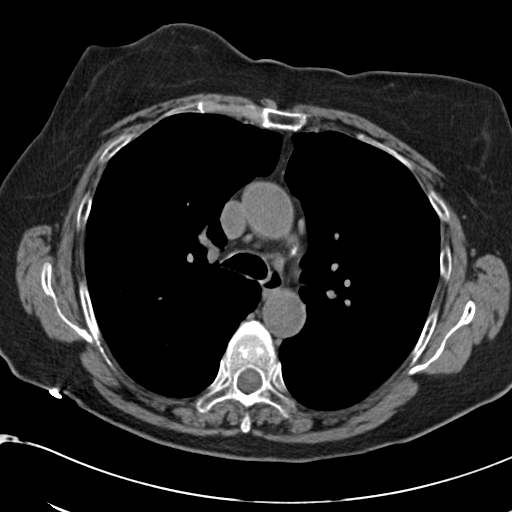
[im 64/100  bone]
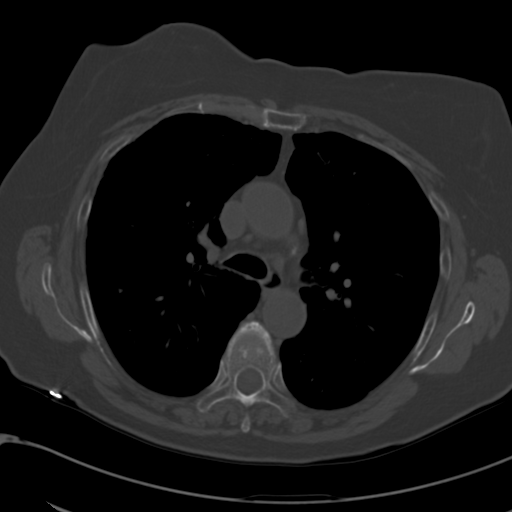
[im 71/100  soft-tissue]
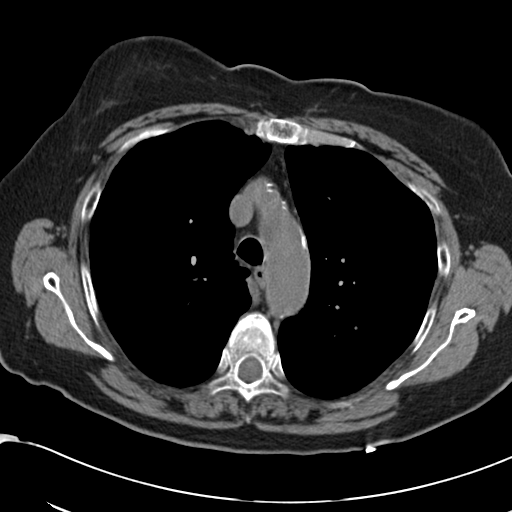
[im 80/100  soft-tissue]
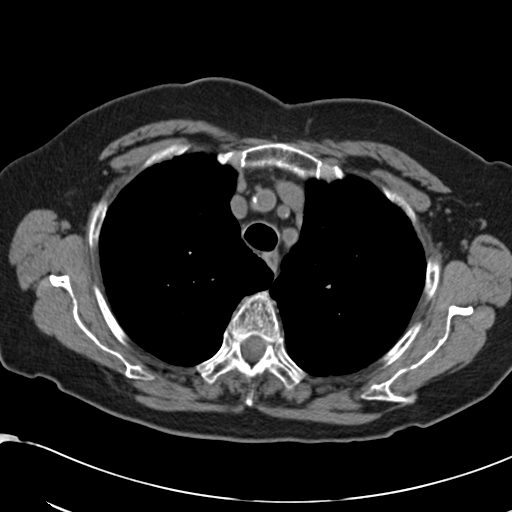
[im 87/100  soft-tissue]
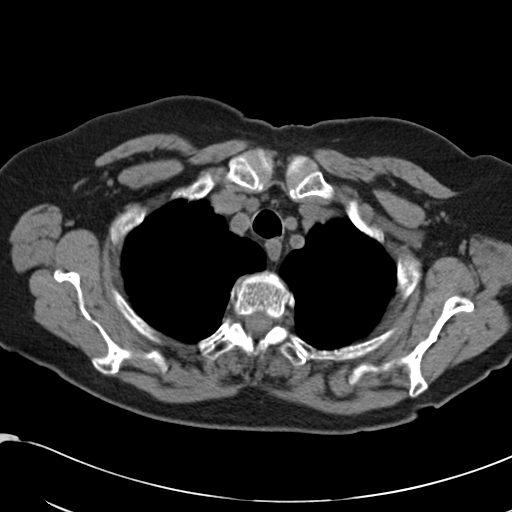
[im 87/100  lung]
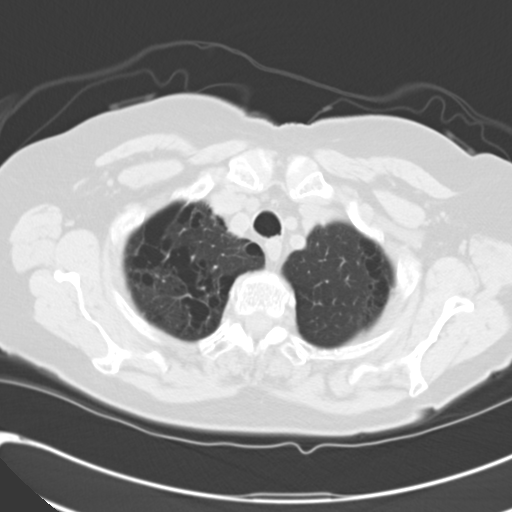
[im 90/100  lung]
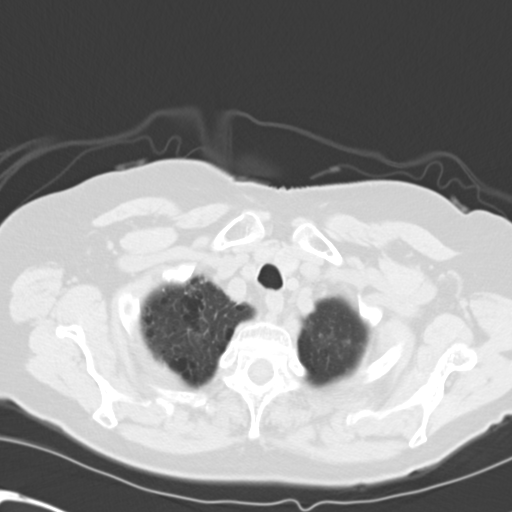
[im 93/100  soft-tissue]
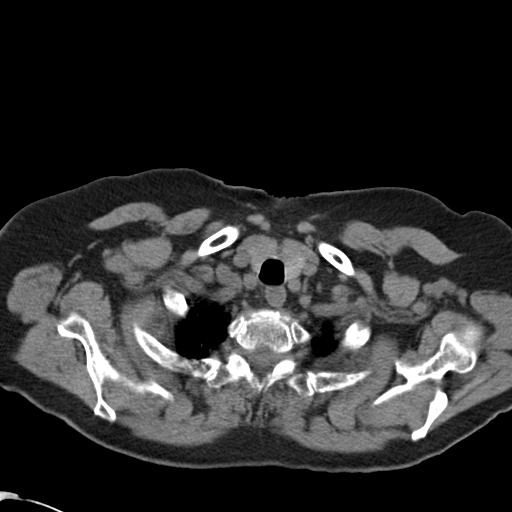
[im 93/100  lung]
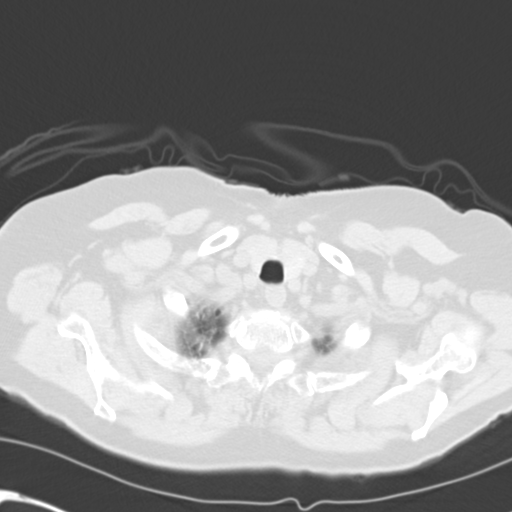
[im 96/100  lung]
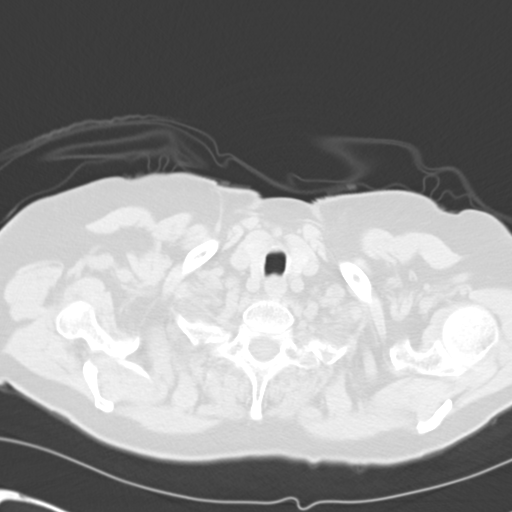

[15 of 32 positions shown; findings below may reference images not displayed]

FINDINGS: No mediastinal, hilar, or axillary lymphadenopathy. Calcifications are seen
in the coronary arteries. Small attenuation focus in the spleen is too small
to characterize.

There is moderate to severe centrilobular emphysema. 6 mm subpleural nodule
in the right lower lobe is similar to prior. 6 mm nodule in the right major
fissure is similar to slightly increased in conspicuity from prior, image
57. There are are scattered ill-defined ground glass opacities in the right
lower lobe which appear new from prior. These may be infectious or
inflammatory, including aspiration. 7 mm linear density in the right middle
lobe is similar to prior. The irregular 9 mm nodular density in the left
upper lobe appears slightly more conspicuous than prior, but still decreased
in size from 06/10/2011. The central airways are patent.

No aggressive lytic or sclerotic osseous lesions are identified.
IMPRESSION: 1. Irregular 9 mm nodular density in the left upper lobe appears slightly
more conspicuous than prior, but still decreased in size from 06/10/2011.
2. There is a 6 mm nodular density along the right major fissure which
appears slightly more conspicuous than prior, though this could be related
to differences in slice selection.
3. New ill-defined ground glass opacities in the right lower lobe. These are
nonspecific, but may be infectious or inflammatory, including the
possibility of aspiration.

## 2014-07-30 ENCOUNTER — Emergency Department

## 2014-07-30 ENCOUNTER — Other Ambulatory Visit: Payer: Self-pay

## 2014-07-30 ENCOUNTER — Encounter: Payer: Self-pay | Admitting: *Deleted

## 2014-07-30 ENCOUNTER — Emergency Department
Admission: EM | Admit: 2014-07-30 | Discharge: 2014-07-30 | Discharge status: Against Medical Advice | Attending: Emergency Medicine | Admitting: Emergency Medicine

## 2014-07-30 DIAGNOSIS — M549 Dorsalgia, unspecified: Secondary | ICD-10-CM

## 2014-07-30 DIAGNOSIS — R079 Chest pain, unspecified: Secondary | ICD-10-CM

## 2014-07-30 DIAGNOSIS — Z720 Tobacco use: Secondary | ICD-10-CM | POA: Diagnosis not present

## 2014-07-30 DIAGNOSIS — J159 Unspecified bacterial pneumonia: Secondary | ICD-10-CM | POA: Diagnosis not present

## 2014-07-30 DIAGNOSIS — J189 Pneumonia, unspecified organism: Secondary | ICD-10-CM

## 2014-07-30 DIAGNOSIS — M546 Pain in thoracic spine: Secondary | ICD-10-CM | POA: Diagnosis present

## 2014-07-30 LAB — CBC
HCT: 32.8 % — ABNORMAL LOW (ref 35.0–47.0)
Hemoglobin: 10.4 g/dL — ABNORMAL LOW (ref 12.0–16.0)
MCH: 35 pg — ABNORMAL HIGH (ref 26.0–34.0)
MCHC: 31.8 g/dL — ABNORMAL LOW (ref 32.0–36.0)
MCV: 110.3 fL — AB (ref 80.0–100.0)
Platelets: 337 10*3/uL (ref 150–440)
RBC: 2.97 MIL/uL — AB (ref 3.80–5.20)
RDW: 20.9 % — ABNORMAL HIGH (ref 11.5–14.5)
WBC: 14.2 10*3/uL — ABNORMAL HIGH (ref 3.6–11.0)

## 2014-07-30 LAB — HEPATIC FUNCTION PANEL
ALT: 8 U/L — AB (ref 14–54)
AST: 16 U/L (ref 15–41)
Albumin: 4.6 g/dL (ref 3.5–5.0)
Alkaline Phosphatase: 91 U/L (ref 38–126)
BILIRUBIN DIRECT: 0.2 mg/dL (ref 0.1–0.5)
BILIRUBIN TOTAL: 1.1 mg/dL (ref 0.3–1.2)
Indirect Bilirubin: 0.9 mg/dL (ref 0.3–0.9)
Total Protein: 7.6 g/dL (ref 6.5–8.1)

## 2014-07-30 LAB — BASIC METABOLIC PANEL
Anion gap: 11 (ref 5–15)
BUN: 21 mg/dL — AB (ref 6–20)
CHLORIDE: 102 mmol/L (ref 101–111)
CO2: 25 mmol/L (ref 22–32)
Calcium: 9.7 mg/dL (ref 8.9–10.3)
Creatinine, Ser: 1.23 mg/dL — ABNORMAL HIGH (ref 0.44–1.00)
GFR calc Af Amer: 47 mL/min — ABNORMAL LOW (ref >60)
GFR calc non Af Amer: 41 mL/min — ABNORMAL LOW (ref >60)
GLUCOSE: 116 mg/dL — AB (ref 65–99)
Potassium: 5.4 mmol/L — ABNORMAL HIGH (ref 3.5–5.1)
Sodium: 138 mmol/L (ref 135–145)

## 2014-07-30 LAB — TROPONIN I: Troponin I: <0.03 ng/mL (ref <0.031)

## 2014-07-30 MED ORDER — VANCOMYCIN HCL 10 G IV SOLR
1.0000 g | Freq: Once | INTRAVENOUS | Status: AC
  Administered 2014-07-30: 1 g via INTRAVENOUS

## 2014-07-30 MED ORDER — AZITHROMYCIN 250 MG PO TABS: ORAL_TABLET | ORAL | Status: AC

## 2014-07-30 MED ORDER — MORPHINE SULFATE 4 MG/ML IJ SOLN
INTRAMUSCULAR | Status: AC
  Filled 2014-07-30: qty 1

## 2014-07-30 MED ORDER — MORPHINE SULFATE 4 MG/ML IJ SOLN
4.0000 mg | Freq: Once | INTRAMUSCULAR | Status: AC
  Administered 2014-07-30: 4 mg via INTRAVENOUS

## 2014-07-30 MED ORDER — SODIUM CHLORIDE 0.9 % IV SOLN
Freq: Once | INTRAVENOUS | Status: AC
  Administered 2014-07-30: 15:00:00 via INTRAVENOUS

## 2014-07-30 MED ORDER — ONDANSETRON HCL 4 MG/2ML IJ SOLN
INTRAMUSCULAR | Status: AC
  Filled 2014-07-30: qty 2

## 2014-07-30 MED ORDER — PIPERACILLIN-TAZOBACTAM 3.375 G IVPB
3.3750 g | Freq: Once | INTRAVENOUS | Status: AC
  Administered 2014-07-30: 3.375 g via INTRAVENOUS

## 2014-07-30 MED ORDER — VANCOMYCIN HCL IN DEXTROSE 1-5 GM/200ML-% IV SOLN
INTRAVENOUS | Status: AC
  Filled 2014-07-30: qty 200

## 2014-07-30 MED ORDER — ONDANSETRON HCL 4 MG/2ML IJ SOLN
4.0000 mg | Freq: Once | INTRAMUSCULAR | Status: AC
  Administered 2014-07-30: 4 mg via INTRAVENOUS

## 2014-07-30 MED ORDER — IOHEXOL 350 MG/ML SOLN
100.0000 mL | Freq: Once | INTRAVENOUS | Status: AC | PRN
  Administered 2014-07-30: 100 mL via INTRAVENOUS

## 2014-07-30 MED ORDER — PIPERACILLIN-TAZOBACTAM 3.375 G IVPB
INTRAVENOUS | Status: AC
  Administered 2014-07-30: 3.375 g via INTRAVENOUS
  Filled 2014-07-30: qty 50

## 2014-07-30 MED ORDER — AZITHROMYCIN 250 MG PO TABS: ORAL_TABLET | ORAL | Status: DC

## 2014-07-30 NOTE — ED Provider Notes (Addendum)
[facility] Emergency Department Provider Note  ____________________________________________  Time seen: Approximately 1:18 PM  I have reviewed the triage vital signs and the nursing notes.   HISTORY  Chief Complaint Back Pain    HPI Kara Clark is a 80 y.o. female Patient complains of back pain pain is severe. Pain appears to be severe enough to make it hard to get an H&P. Patient said the pain goes down her left side across the back and back up on the right side. Pain appeared to start about 2-3 days ago and gets worse and has worsened. Pain is made worse by coughing. Patient is unable to tell me if the pain is worse with movement at all.patient does not have a fever does not have vomiting. Patient has a chronic cough. The cough is productive of clear sputum. Patient reports the pain is so severe it hard to breathe. She is unable to tell me if she is short of breath getting enough air just the pain makes it hurt too much to breathe deeply. I am unable to obtain a history of any recent surgery falls or injuries. The only other associated symptom is the fact that she has little bit of epigastric pain and that seems to radiate through to the back.   Past Medical History  Diagnosis Date  . History of GI bleed 2010  . CAD (coronary artery disease)   . Cervical cancer   . CHF (congestive heart failure)   . COPD (chronic obstructive pulmonary disease)   . Hyperlipidemia   . Lung mass     43m pulmonary nodule  . Eosinophilia     prior BCR neg, FISH for PDGF neg  . History of hemoptysis   . Hydronephrosis of right kidney 09/2011  . B12 deficiency anemia   . Chronic anemia   . History of pneumonia 12/2013  . History of MI (myocardial infarction) 12/2012  . History of respiratory failure 12/2012  . Duodenitis   . History of ataxia     Patient Active Problem List   Diagnosis Date Noted  . DNAR (do not attempt resuscitation) 05/28/2014  . Lung mass  05/28/2014  . Gastric catarrh 01/08/2014  . 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 A-fib 07/30/2013  . Absolute anemia 06/25/2013  . Cramp in muscle 06/25/2013  . Chronic kidney disease (CKD), stage III (moderate) 06/24/2013  . COPD, moderate 06/24/2013  . CAD in native artery 06/24/2013  . Eosinophil count raised 06/24/2013  . Benign essential HTN 06/24/2013  . HLD (hyperlipidemia) 06/24/2013  . Avitaminosis D 06/24/2013    Past Surgical History  Procedure Laterality Date  . Athrectomy directional coronary  03/22/2008  . Coronary angioplasty with stent placement  03/22/2008  . Cataract extraction  2009  . Right oophorectomy  1968  . Total abdominal hysterectomy  1968  . Esophagogastroduodenoscopy  10/2013  . Colonoscopy  10/2013    Current Outpatient Rx  Name  Route  Sig  Dispense  Refill  . acetaminophen (TYLENOL) 500 MG tablet   Oral   Take 1,000 mg by mouth every 4 (four) hours as needed for mild pain or headache.         . albuterol (PROVENTIL HFA;VENTOLIN HFA) 108 (90 BASE) MCG/ACT inhaler   Inhalation   Inhale 1 puff into the lungs every 4 (four) hours as needed for wheezing or shortness of breath.         . Cholecalciferol (VITAMIN D3) 2000 UNITS TABS  Oral   Take 1 tablet by mouth daily.         . cyanocobalamin (,VITAMIN B-12,) 1000 MCG/ML injection   Intramuscular   Inject 1,000 mcg into the muscle every 30 (thirty) days.         . furosemide (LASIX) 20 MG tablet   Oral   Take 20 mg by mouth daily.         . magnesium oxide (MAG-OX) 400 MG tablet   Oral   Take 400 mg by mouth daily.         . meclizine (ANTIVERT) 25 MG tablet   Oral   Take 1 tablet (25 mg total) by mouth 3 (three) times daily as needed for dizziness or nausea.   30 tablet   1   . nitroGLYCERIN (NITROSTAT) 0.4 MG SL tablet   Sublingual   Place 0.4 mg under the tongue every 5 (five) minutes as needed for chest pain.         . ranitidine (ZANTAC) 150 MG tablet   Oral    Take 150 mg by mouth 2 (two) times daily.         . roflumilast (DALIRESP) 500 MCG TABS tablet   Oral   Take 500 mcg by mouth daily.           Allergies Metoprolol; Mirtazapine; Nsaids; Simvastatin; Asa; Lidoderm; and Pantoprazole  No family history on file.  Social History History  Substance Use Topics  . Smoking status: Current Every Day Smoker -- 0.50 packs/day    Types: Cigarettes  . Smokeless tobacco: Current User  . Alcohol Use: No    Review of Systems Constitutional: No fever/chills Eyes: No visual changes. ENT: No sore throat. Gastrointestinal: No abdominal pain.  No nausea, no vomiting.  No diarrhea.  No constipation. Genitourinary: Negative for dysuria. Musculoskeletal: Negative for back pain. Skin: Negative for rash. Neurological: Negative for headaches, focal weakness or numbness.  10-point ROS otherwise negative.  ____________________________________________   PHYSICAL EXAM:  VITAL SIGNS: ED Triage Vitals  Enc Vitals Group     BP 07/30/14 1236 148/79 mmHg     Pulse Rate 07/30/14 1236 95     Resp --      Temp 07/30/14 1236 97.7 F (36.5 C)     Temp Source 07/30/14 1236 Oral     SpO2 07/30/14 1236 92 %     Weight 07/30/14 1236 105 lb (47.628 kg)     Height 07/30/14 1236 '5\' 4"'$  (1.626 m)     Head Cir --      Peak Flow --      Pain Score 07/30/14 1237 10     Pain Loc --      Pain Edu? --      Excl. in [place]? --     Constitutional: Alert and oriented. Well appearing and in no acute distress. Eyes: Conjunctivae are normal. PERRL. EOMI. Head: Atraumatic. Nose: No congestion/rhinnorhea. Mouth/Throat: Mucous membranes are moist.  Oropharynx non-erythematous. Neck: No stridor.   Cardiovascular: Normal rate, regular rhythm. Grossly normal heart sounds.  Good peripheral circulation. Respiratory: Normal respiratory effort.  No retractions. Lungs CTAB. Gastrointestinal: Soft and nontender. No distention. No abdominal bruits. No CVA  tenderness. Musculoskeletal: No lower extremity tenderness nor edema.  No joint effusions.and has just the 100s posterior  Neurologic:  Normal speech and language. No gross focal neurologic deficits are appreciated. Speech is normal. No gait instability. Skin:  Skin is warm, dry and intact. No rash noted. Psychiatric: Mood  and affect are normal. Speech and behavior are normal.  ____________________________________________   LABS (all labs ordered are listed, but only abnormal results are displayed)  Labs Reviewed  BASIC METABOLIC PANEL - Abnormal; Notable for the following:    Potassium 5.4 (*)    Glucose, Bld 116 (*)    BUN 21 (*)    Creatinine, Ser 1.23 (*)    GFR calc non Af Amer 41 (*)    GFR calc Af Amer 47 (*)    All other components within normal limits  CBC - Abnormal; Notable for the following:    WBC 14.2 (*)    RBC 2.97 (*)    Hemoglobin 10.4 (*)    HCT 32.8 (*)    MCV 110.3 (*)    MCH 35.0 (*)    MCHC 31.8 (*)    RDW 20.9 (*)    All other components within normal limits  CULTURE, BLOOD (ROUTINE X 2)  CULTURE, BLOOD (ROUTINE X 2)  TROPONIN I   ____________________________________________  EKG  EKG read and interpreted by me sinus rhythm PACs and aberrant conduction. Right axis. Right bundle branch block. EKG was compared to one from 10 May which showed no marked changes from that time. ____________________________________________  XMIWOEHOZ  CT results reviewed by me show bibasilar densities with collapse and a upper lobe mass possibly cancerous ____________________________________________   PROCEDURES  Procedure(s) performed:  Critical Care performed:   ____________________________________________   INITIAL IMPRESSION / ASSESSMENT AND PLAN / ED COURSE  Pertinent labs & imaging results that were available during my care of the patient were reviewed by me and considered in my medical decision making (see chart for details).  Patient has a past  history of repeated episodes of pneumonia patient's white count is elevated BUN/creatinine are also increased from previous visits and will give her more IV fluids and Biaxin to treat pneumonia and plan on watching her overnight ____________________________________________   FINAL CLINICAL IMPRESSION(S) / ED DIAGNOSESdiagnosis  Final diagnoses:  Healthcare-associated pneumonia      Nena Polio, MD 07/30/14 1527  I had explained to patient she had a pneumonia and was Put her in a hospital patient did not say to make several this is the fourth time. When the hospitalist she told him she didn't want to stay in the hospital. I went to speak to her again explained to her that she does have a pneumonia and she couldn't get much much sicker. She says she really doesn't care she's been there before and understands the risks of going back to hospice. I will treat her with by mouth and about X and she can return if she changes her mind or if she feels worse.  Nena Polio, MD 07/30/14 [phone]

## 2014-07-30 NOTE — Consult Note (Signed)
[facility] [place] NAME: Kara Clark    MR#:  [id number]  DATE OF BIRTH:  03/13/1934  DATE OF CONSULT:  07/30/2014  PRIMARY CARE PHYSICIAN: Ezequiel Kayser, MD   REQUESTING/REFERRING PHYSICIAN: Dr. Conni Slipper  CHIEF COMPLAINT:   Chief Complaint  Patient presents with  . Back Pain    HISTORY OF PRESENT ILLNESS:  Kara Clark  is a 80 y.o. female with a known history of chronic disease, cervical cancer, COPD, CHF, history of previous GI bleed, hypertension, chronic anemia, who presented to the hospital complaining of left lower flank pain. Patient was at her cardiologist's office for a routine follow-up and developed worsening symptoms of left lower flank/upper back pain and therefore came to the ER for further evaluation. Patient underwent a CT chest which showed a possible left lower lobe pneumonia. Hospitalist services were contacted for admission and patient was going to be admitted for treatment of the acute pneumonia but the patient has decided to leave [facility] from the emergency room. Patient does admit to a cough which is productive of clear sputum but no fevers, nausea, vomiting or any other associated symptoms presently.  PAST MEDICAL HISTORY:   Past Medical History  Diagnosis Date  . History of GI bleed 2010  . CAD (coronary artery disease)   . Cervical cancer   . CHF (congestive heart failure)   . COPD (chronic obstructive pulmonary disease)   . Hyperlipidemia   . Lung mass     71m pulmonary nodule  . Eosinophilia     prior BCR neg, FISH for PDGF neg  . History of hemoptysis   . Hydronephrosis of right kidney 09/2011  . B12 deficiency anemia   . Chronic anemia   . History of pneumonia 12/2013  . History of MI (myocardial infarction) 12/2012  . History of respiratory failure 12/2012  . Duodenitis   . History of ataxia     PAST SURGICAL HISTOIRY:   Past Surgical History  Procedure Laterality  Date  . Athrectomy directional coronary  03/22/2008  . Coronary angioplasty with stent placement  03/22/2008  . Cataract extraction  2009  . Right oophorectomy  1968  . Total abdominal hysterectomy  1968  . Esophagogastroduodenoscopy  10/2013  . Colonoscopy  10/2013    SOCIAL HISTORY:   History  Substance Use Topics  . Smoking status: Current Every Day Smoker -- 0.50 packs/day    Types: Cigarettes  . Smokeless tobacco: Current User  . Alcohol Use: No    FAMILY HISTORY:   Family History  Problem Relation Age of Onset  . Heart attack Mother   . Heart attack Father     DRUG ALLERGIES:   Allergies  Allergen Reactions  . Metoprolol Rash  . Mirtazapine Nausea Only  . Nsaids Swelling  . Simvastatin Other (See Comments)    Reaction:  Unknown  . Asa [Aspirin] Diarrhea, Nausea And Vomiting and Swelling  . Lidoderm [Lidocaine] Other (See Comments)    Reaction:  Unknown   . Pantoprazole Diarrhea    REVIEW OF SYSTEMS:   Review of Systems  Constitutional: Negative for fever and weight loss.  HENT: Negative for congestion, nosebleeds and tinnitus.   Eyes: Negative for blurred vision, double vision and redness.  Respiratory: Positive for cough and sputum production. Negative for hemoptysis and shortness of breath.   Cardiovascular: Negative for chest pain, orthopnea, leg swelling and PND.  Gastrointestinal: Negative for nausea, vomiting, abdominal pain,  diarrhea and melena.  Genitourinary: Negative for dysuria, urgency and hematuria.  Musculoskeletal: Positive for back pain (left upper back pain). Negative for joint pain and falls.  Skin: Negative for rash.  Neurological: Negative for dizziness, tingling, sensory change, focal weakness, seizures, weakness and headaches.  Endo/Heme/Allergies: Negative for polydipsia. Does not bruise/bleed easily.  Psychiatric/Behavioral: Negative for depression and memory loss. The patient is not nervous/anxious.      MEDICATIONS AT HOME:    Prior to Admission medications   Medication Sig Start Date End Date Taking? Authorizing Provider  acetaminophen (TYLENOL) 500 MG tablet Take 1,000 mg by mouth every 4 (four) hours as needed for mild pain or headache.   Yes Historical Provider, MD  albuterol (PROVENTIL HFA;VENTOLIN HFA) 108 (90 BASE) MCG/ACT inhaler Inhale 1 puff into the lungs every 4 (four) hours as needed for wheezing or shortness of breath.   Yes Historical Provider, MD  Cholecalciferol (VITAMIN D3) 2000 UNITS TABS Take 1 tablet by mouth daily.   Yes Historical Provider, MD  cyanocobalamin (,VITAMIN B-12,) 1000 MCG/ML injection Inject 1,000 mcg into the muscle every 30 (thirty) days.   Yes Historical Provider, MD  furosemide (LASIX) 20 MG tablet Take 20 mg by mouth daily.   Yes Historical Provider, MD  magnesium oxide (MAG-OX) 400 MG tablet Take 400 mg by mouth daily.   Yes Historical Provider, MD  meclizine (ANTIVERT) 25 MG tablet Take 1 tablet (25 mg total) by mouth 3 (three) times daily as needed for dizziness or nausea. 06/10/14 06/10/15 Yes Sheryl L Benjaman Lobe, DO  nitroGLYCERIN (NITROSTAT) 0.4 MG SL tablet Place 0.4 mg under the tongue every 5 (five) minutes as needed for chest pain.   Yes Historical Provider, MD  ranitidine (ZANTAC) 150 MG tablet Take 150 mg by mouth 2 (two) times daily.   Yes Historical Provider, MD  roflumilast (DALIRESP) 500 MCG TABS tablet Take 500 mcg by mouth daily.   Yes Historical Provider, MD  azithromycin (ZITHROMAX Z-PAK) 250 MG tablet Take 2 tablets (500 mg) on  Day 1,  followed by 1 tablet (250 mg) once daily on Days 2 through 5. 07/30/14 08/04/14  Nena Polio, MD      VITAL SIGNS:  Blood pressure 108/55, pulse 80, temperature 97.7 F (36.5 C), temperature source Oral, resp. rate 24, height '5\' 4"'$  (1.626 m), weight 47.628 kg (105 lb), SpO2 96 %.  PHYSICAL EXAMINATION:  GENERAL:  80 y.o.-year-old patient lying in the bed with no acute distress.  EYES: Pupils equal, round, reactive to light  and accommodation. No scleral icterus. Extraocular muscles intact.  HEENT: Head atraumatic, normocephalic. Oropharynx and nasopharynx clear. Moist oral mucosa.  NECK:  Supple, no jugular venous distention. No thyroid enlargement, no tenderness.  LUNGS: Normal breath sounds bilaterally, no wheezing, rales, rhonchi . No use of accessory muscles of respiration.  CARDIOVASCULAR: S1, S2, RRR. No murmurs, rubs, gallops, clicks.  ABDOMEN: Soft, nontender, nondistended. Bowel sounds present. No organomegaly or mass.  EXTREMITIES: No pedal edema, cyanosis, or clubbing.  NEUROLOGIC: Cranial nerves II through XII are intact. No focal motor or sensory deficits appreciated bilaterally  PSYCHIATRIC: The patient is alert and oriented x 3. Good affect SKIN: No obvious rash, lesion, or ulcer.   LABORATORY PANEL:   CBC  Recent Labs Lab 07/30/14 1248  WBC 14.2*  HGB 10.4*  HCT 32.8*  PLT 337   ------------------------------------------------------------------------------------------------------------------  Chemistries   Recent Labs Lab 07/30/14 1248  NA 138  K 5.4*  CL 102  CO2 25  GLUCOSE 116*  BUN 21*  CREATININE 1.23*  CALCIUM 9.7  AST 16  ALT 8*  ALKPHOS 91  BILITOT 1.1   ------------------------------------------------------------------------------------------------------------------  Cardiac Enzymes  Recent Labs Lab 07/30/14 1248  TROPONINI <0.03   ------------------------------------------------------------------------------------------------------------------  RADIOLOGY:  Ct Angio Chest Aorta W/cm &/or Wo/cm  07/30/2014   CLINICAL DATA:  Chest pain, back pain, shortness of breath.  EXAM: CT ANGIOGRAPHY CHEST  CT ANGIOGRAPHY ABDOMEN AND PELVIS WITH CONTRAST  TECHNIQUE: Multidetector CT imaging of the chest, abdomen and pelvis was performed using the standard protocol during bolus administration of intravenous contrast. Multiplanar CT image reconstructions and MIPs were  obtained to evaluate the vascular anatomy.  CONTRAST:  181m OMNIPAQUE IOHEXOL 350 MG/ML SOLN  COMPARISON:  None.  FINDINGS: CTA CHEST FINDINGS  No filling defects in the pulmonary arteries to suggest pulmonary emboli. Heart is enlarged. Coronary artery and scattered aortic calcifications. No evidence of aortic aneurysm or dissection.  Moderate to severe emphysema. Airspace opacities and air bronchograms in the right lower lobe. Collapse and atelectasis throughout the left lower lobe. No visible obstructing endobronchial or central process. There is a spiculated nodule in the left upper lobe measuring 2 cm on image 25. No pleural effusions.  Chest wall soft tissues are unremarkable. No acute bony abnormality or focal bone lesion.  CTA ABDOMEN AND PELVIS FINDINGS  Aorta and branch vessels are heavily calcified, non aneurysmal. No dissection. Moderate stenosis at the origin of the left renal artery. Right renal artery and mesenteric vessels appear patent without significant focal stenosis. Iliac vessels are calcified, non aneurysmal.  Small layering stones within the gallbladder. Liver spleen, pancreas, kidneys are unremarkable. Mild fullness of the adrenal glands bilaterally compatible with hyperplasia.  Prior hysterectomy. No adnexal masses. Urinary bladder is unremarkable. Left colonic diverticulosis. No active diverticulitis. Small bowel and stomach are decompressed, grossly unremarkable. No free fluid, free air or adenopathy.  Review of the MIP images confirms the above findings.  IMPRESSION: No evidence of aortic aneurysm or dissection. No pulmonary embolus. Coronary artery and aorta iliac of atherosclerosis.  Collapse of the left lower lobe. Air bronchograms throughout the right lower lobe with partial collapse. No visible endobronchial or central obstructing process. May consider further evaluation with bronchoscopy.  2.0 cm spiculated nodule in the left upper lobe. Cannot exclude malignancy. Consider further  evaluation with PET CT.  Moderate to severe emphysema.  Cardiomegaly.  Left colonic diverticulosis.   Electronically Signed   By: KRolm BaptiseM.D.   On: 07/30/2014 14:34   Ct Angio Abd/pel W/ And/or W/o  07/30/2014   CLINICAL DATA:  Chest pain, back pain, shortness of breath.  EXAM: CT ANGIOGRAPHY CHEST  CT ANGIOGRAPHY ABDOMEN AND PELVIS WITH CONTRAST  TECHNIQUE: Multidetector CT imaging of the chest, abdomen and pelvis was performed using the standard protocol during bolus administration of intravenous contrast. Multiplanar CT image reconstructions and MIPs were obtained to evaluate the vascular anatomy.  CONTRAST:  1075mOMNIPAQUE IOHEXOL 350 MG/ML SOLN  COMPARISON:  None.  FINDINGS: CTA CHEST FINDINGS  No filling defects in the pulmonary arteries to suggest pulmonary emboli. Heart is enlarged. Coronary artery and scattered aortic calcifications. No evidence of aortic aneurysm or dissection.  Moderate to severe emphysema. Airspace opacities and air bronchograms in the right lower lobe. Collapse and atelectasis throughout the left lower lobe. No visible obstructing endobronchial or central process. There is a spiculated nodule in the left upper lobe measuring 2 cm on image 25. No pleural effusions.  Chest wall  soft tissues are unremarkable. No acute bony abnormality or focal bone lesion.  CTA ABDOMEN AND PELVIS FINDINGS  Aorta and branch vessels are heavily calcified, non aneurysmal. No dissection. Moderate stenosis at the origin of the left renal artery. Right renal artery and mesenteric vessels appear patent without significant focal stenosis. Iliac vessels are calcified, non aneurysmal.  Small layering stones within the gallbladder. Liver spleen, pancreas, kidneys are unremarkable. Mild fullness of the adrenal glands bilaterally compatible with hyperplasia.  Prior hysterectomy. No adnexal masses. Urinary bladder is unremarkable. Left colonic diverticulosis. No active diverticulitis. Small bowel and stomach  are decompressed, grossly unremarkable. No free fluid, free air or adenopathy.  Review of the MIP images confirms the above findings.  IMPRESSION: No evidence of aortic aneurysm or dissection. No pulmonary embolus. Coronary artery and aorta iliac of atherosclerosis.  Collapse of the left lower lobe. Air bronchograms throughout the right lower lobe with partial collapse. No visible endobronchial or central obstructing process. May consider further evaluation with bronchoscopy.  2.0 cm spiculated nodule in the left upper lobe. Cannot exclude malignancy. Consider further evaluation with PET CT.  Moderate to severe emphysema.  Cardiomegaly.  Left colonic diverticulosis.   Electronically Signed   By: Rolm Baptise M.D.   On: 07/30/2014 14:34     IMPRESSION AND PLAN:   80 year old female with past medical history of hypertension, hyperlipidemia, COPD on home O2, history of CAD, history of CHF, history of cervical cancer, chronic anemia, history of previous GI bleed, who presented to the hospital due to left upper back pain and suspected to have pneumonia.  #1 pneumonia-this is likely the cause of patient's left upper back pain. Patient does have CT scan findings suggestive of pneumonia as mentioned above. Patient is clinically afebrile and hemodynamically stable but does have a mild leukocytosis. -Patient was going to be admitted for treatment of pneumonia with IV antibiotics although the patient refuses to get admitted and is leaving [place]. Patient will receive 1 dose of IV antibiotics in the emergency room and likely due discharged on oral Levaquin. This has been discussed with the ER physician.  #2 COPD-no evidence of acute COPD exacerbation. -Continue Daliresp, patient is already on oxygen at home.  #3 GERD-continue ranitidine  #4 history of CHF-patient is clinically not in congestive heart failure. -Continue Lasix.   All the records are reviewed and case discussed with Consulting  provider. Management plans discussed with the patient, family and they are in agreement.  CODE STATUS: Full  TOTAL TIME TAKING CARE OF THIS PATIENT: 45 minutes.    Henreitta Leber M.D on 07/30/2014 at 5:31 PM  Between 7am to 6pm - Pager - [phone]  After 6pm go to [email or web address] - password [email or web address] [facility]  [facility]  Office  [phone]  CC: Primary care Physician: Ezequiel Kayser, MD

## 2014-07-30 NOTE — ED Notes (Addendum)
Pt reports increased back pain, pt reports having a history of chronic pain, pt reports increased shortness of breath, pt wears home O2 and did not bring it.pt placed on O2@ 2liters

## 2014-07-30 NOTE — ED Notes (Signed)
Pt reports back pain that radiates to left side.

## 2014-07-30 NOTE — Progress Notes (Signed)
ED visit made. Patient to the [facility] ED from MD appointment for evaluation of back pain. Kara Clark is followed by [facility] at home and is a DNR. Patient seen lying on the ED stretcher, alert and interactive, some what sleepy. She was able to relate why she had come to the ED and from where. She reports she had been having severe back pain for the past few days, she only takes tylenol as this is her choice. ED Physician Dr. Cinda Quest made aware that Kara Clark is a hospice patient. He reports that based on the chest CT and increased WBC patient may have some pneumonia and would like to admit her to the hospital for treatment. Writer spoke to the patient who does not want to be admitted. Writer assured her that she ca make her own choice. Hospice team alerted to ED visit and possible discharge back home. Thank you. Flo Shanks RN, BSN, [place] [facility] [place] [phone] c

## 2014-07-30 NOTE — ED Notes (Signed)
Resumed care from denia rn.  Pt alert.  nsr on monitor.  Iv in place.  md in with pt again.  Pt denies any pain.

## 2014-07-30 NOTE — Discharge Instructions (Signed)
I would like it if you would stay in the hospital. I believe it would be safer for you. Please have the Hospice keep an eye on you. Follow up with your doctor in the next 2-3 days. Take the tylenol for pain, take the zithromax for the pneumonia. Return for fever, vomiting, shortness of breath of worse pain.

## 2014-08-04 LAB — CULTURE, BLOOD (ROUTINE X 2)
CULTURE: NO GROWTH
Culture: NO GROWTH
SPECIAL REQUESTS: NORMAL
SPECIAL REQUESTS: NORMAL

## 2014-09-01 LAB — IRON AND TIBC
IRON SATURATION: 29 %
Iron Bind.Cap.(Total): 255 ug/dL (ref 250–450)
Iron: 73 ug/dL (ref 50–170)
Unbound Iron-Bind.Cap.: 182 ug/dL

## 2014-09-08 ENCOUNTER — Other Ambulatory Visit: Payer: Self-pay | Admitting: Specialist

## 2014-09-08 DIAGNOSIS — R918 Other nonspecific abnormal finding of lung field: Secondary | ICD-10-CM

## 2014-09-12 ENCOUNTER — Ambulatory Visit
Admission: RE | Admit: 2014-09-12 | Discharge: 2014-09-12 | Disposition: A | Discharge status: Home / Self Care | Source: Ambulatory Visit | Attending: Specialist | Admitting: Specialist

## 2014-09-12 DIAGNOSIS — I2510 Atherosclerotic heart disease of native coronary artery without angina pectoris: Secondary | ICD-10-CM | POA: Insufficient documentation

## 2014-09-12 DIAGNOSIS — J9811 Atelectasis: Secondary | ICD-10-CM | POA: Diagnosis not present

## 2014-09-12 DIAGNOSIS — J439 Emphysema, unspecified: Secondary | ICD-10-CM | POA: Diagnosis not present

## 2014-09-12 DIAGNOSIS — R918 Other nonspecific abnormal finding of lung field: Secondary | ICD-10-CM | POA: Insufficient documentation

## 2014-09-12 LAB — GLUCOSE, CAPILLARY: Glucose-Capillary: 85 mg/dL (ref 65–99)

## 2014-09-12 MED ORDER — FLUDEOXYGLUCOSE F - 18 (FDG) INJECTION
11.5700 | Freq: Once | INTRAVENOUS | Status: DC | PRN
  Administered 2014-09-12: 11.57 via INTRAVENOUS
  Filled 2014-09-12: qty 11.57

## 2014-10-31 ENCOUNTER — Emergency Department

## 2014-10-31 ENCOUNTER — Emergency Department
Admission: EM | Admit: 2014-10-31 | Discharge: 2014-10-31 | Disposition: A | Discharge status: Home / Self Care | Attending: Emergency Medicine | Admitting: Emergency Medicine

## 2014-10-31 ENCOUNTER — Encounter: Payer: Self-pay | Admitting: Emergency Medicine

## 2014-10-31 DIAGNOSIS — Z720 Tobacco use: Secondary | ICD-10-CM | POA: Diagnosis not present

## 2014-10-31 DIAGNOSIS — Z79899 Other long term (current) drug therapy: Secondary | ICD-10-CM | POA: Insufficient documentation

## 2014-10-31 DIAGNOSIS — R0602 Shortness of breath: Secondary | ICD-10-CM | POA: Diagnosis not present

## 2014-10-31 DIAGNOSIS — F419 Anxiety disorder, unspecified: Secondary | ICD-10-CM | POA: Diagnosis not present

## 2014-10-31 DIAGNOSIS — N183 Chronic kidney disease, stage 3 (moderate): Secondary | ICD-10-CM | POA: Insufficient documentation

## 2014-10-31 DIAGNOSIS — I129 Hypertensive chronic kidney disease with stage 1 through stage 4 chronic kidney disease, or unspecified chronic kidney disease: Secondary | ICD-10-CM | POA: Insufficient documentation

## 2014-10-31 DIAGNOSIS — R0600 Dyspnea, unspecified: Secondary | ICD-10-CM | POA: Diagnosis present

## 2014-10-31 LAB — CBC
HCT: 27.2 % — ABNORMAL LOW (ref 35.0–47.0)
Hemoglobin: 8.7 g/dL — ABNORMAL LOW (ref 12.0–16.0)
MCH: 36 pg — ABNORMAL HIGH (ref 26.0–34.0)
MCHC: 32.1 g/dL (ref 32.0–36.0)
MCV: 111.9 fL — ABNORMAL HIGH (ref 80.0–100.0)
PLATELETS: 228 10*3/uL (ref 150–440)
RBC: 2.43 MIL/uL — ABNORMAL LOW (ref 3.80–5.20)
RDW: 18 % — AB (ref 11.5–14.5)
WBC: 11.1 10*3/uL — ABNORMAL HIGH (ref 3.6–11.0)

## 2014-10-31 LAB — COMPREHENSIVE METABOLIC PANEL
ALT: 10 U/L — ABNORMAL LOW (ref 14–54)
ANION GAP: 6 (ref 5–15)
AST: 22 U/L (ref 15–41)
Albumin: 3.5 g/dL (ref 3.5–5.0)
Alkaline Phosphatase: 86 U/L (ref 38–126)
BUN: 14 mg/dL (ref 6–20)
CHLORIDE: 105 mmol/L (ref 101–111)
CO2: 26 mmol/L (ref 22–32)
Calcium: 8.4 mg/dL — ABNORMAL LOW (ref 8.9–10.3)
Creatinine, Ser: 0.96 mg/dL (ref 0.44–1.00)
GFR calc Af Amer: >60 mL/min (ref >60)
GFR, EST NON AFRICAN AMERICAN: 55 mL/min — AB (ref >60)
Glucose, Bld: 126 mg/dL — ABNORMAL HIGH (ref 65–99)
POTASSIUM: 3.2 mmol/L — AB (ref 3.5–5.1)
Sodium: 137 mmol/L (ref 135–145)
TOTAL PROTEIN: 6 g/dL — AB (ref 6.5–8.1)
Total Bilirubin: 1.4 mg/dL — ABNORMAL HIGH (ref 0.3–1.2)

## 2014-10-31 LAB — TROPONIN I: Troponin I: 0.06 ng/mL — ABNORMAL HIGH (ref <0.031)

## 2014-10-31 MED ORDER — LEVOFLOXACIN 500 MG PO TABS: 500.0000 mg | ORAL_TABLET | Freq: Every day | ORAL | Status: AC

## 2014-10-31 MED ORDER — LORAZEPAM 0.5 MG PO TABS
0.5000 mg | ORAL_TABLET | Freq: Once | ORAL | Status: AC
  Administered 2014-10-31: 0.5 mg via ORAL

## 2014-10-31 MED ORDER — LORAZEPAM 2 MG/ML IJ SOLN
INTRAMUSCULAR | Status: AC
  Filled 2014-10-31: qty 1

## 2014-10-31 MED ORDER — LORAZEPAM 0.5 MG PO TABS
ORAL_TABLET | ORAL | Status: AC
  Administered 2014-10-31: 0.5 mg via ORAL
  Filled 2014-10-31: qty 1

## 2014-10-31 NOTE — ED Provider Notes (Signed)
[facility] Emergency Department Provider Note  ____________________________________________  Time seen: On arrival  I have reviewed the triage vital signs and the nursing notes.   HISTORY  Chief Complaint Respiratory Distress    HPI Kara Clark is a 80 y.o. female with a history of lung cancer severe COPD, CHF, heart disease on oxygen at home and under hospice care presents today after episode of shortness of breath. Patient tells me that her oxygen was "choking her" so she took it off and then began to feel anxious and short of breath and called EMS. She denies fevers chills. She does have a history of anxiety. She has no chest pain in the emergency part. She reports her breathing feels normal now. She is quite fixated on the fact that her oxygen causes negative effects on her     Past Medical History  Diagnosis Date  . History of GI bleed 2010  . CAD (coronary artery disease)   . Cervical cancer   . CHF (congestive heart failure)   . COPD (chronic obstructive pulmonary disease)   . Hyperlipidemia   . Lung mass     52m pulmonary nodule  . Eosinophilia     prior BCR neg, FISH for PDGF neg  . History of hemoptysis   . Hydronephrosis of right kidney 09/2011  . B12 deficiency anemia   . Chronic anemia   . History of pneumonia 12/2013  . History of MI (myocardial infarction) 12/2012  . History of respiratory failure 12/2012  . Duodenitis   . History of ataxia     Patient Active Problem List   Diagnosis Date Noted  . DNAR (do not attempt resuscitation) 05/28/2014  . Lung mass 05/28/2014  . Gastric catarrh 01/08/2014  . 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 A-fib 07/30/2013  . Absolute anemia 06/25/2013  . Cramp in muscle 06/25/2013  . Chronic kidney disease (CKD), stage III (moderate) 06/24/2013  . COPD, moderate 06/24/2013  . CAD in native artery 06/24/2013  . Eosinophil count raised 06/24/2013  . Benign essential HTN 06/24/2013  .  HLD (hyperlipidemia) 06/24/2013  . Avitaminosis D 06/24/2013    Past Surgical History  Procedure Laterality Date  . Athrectomy directional coronary  03/22/2008  . Coronary angioplasty with stent placement  03/22/2008  . Cataract extraction  2009  . Right oophorectomy  1968  . Total abdominal hysterectomy  1968  . Esophagogastroduodenoscopy  10/2013  . Colonoscopy  10/2013    Current Outpatient Rx  Name  Route  Sig  Dispense  Refill  . acetaminophen (TYLENOL) 500 MG tablet   Oral   Take 1,000 mg by mouth every 4 (four) hours as needed for mild pain or headache.         . albuterol (PROVENTIL HFA;VENTOLIN HFA) 108 (90 BASE) MCG/ACT inhaler   Inhalation   Inhale 1 puff into the lungs every 4 (four) hours as needed for wheezing or shortness of breath.         . ALPRAZolam (XANAX) 0.25 MG tablet   Oral   Take 0.25 mg by mouth 3 (three) times daily as needed for anxiety.         . Cholecalciferol (VITAMIN D3) 2000 UNITS TABS   Oral   Take 1 tablet by mouth daily.         . cyanocobalamin (,VITAMIN B-12,) 1000 MCG/ML injection   Intramuscular   Inject 1,000 mcg into the muscle every 30 (thirty) days.         .Marland Kitchen  furosemide (LASIX) 20 MG tablet   Oral   Take 20 mg by mouth daily.         . magnesium oxide (MAG-OX) 400 MG tablet   Oral   Take 400 mg by mouth daily.         . nitroGLYCERIN (NITROSTAT) 0.4 MG SL tablet   Sublingual   Place 0.4 mg under the tongue every 5 (five) minutes as needed for chest pain.         . ranitidine (ZANTAC) 150 MG tablet   Oral   Take 150 mg by mouth 2 (two) times daily.         . roflumilast (DALIRESP) 500 MCG TABS tablet   Oral   Take 500 mcg by mouth daily.         . Tiotropium Bromide Monohydrate (SPIRIVA RESPIMAT) 2.5 MCG/ACT AERS   Inhalation   Inhale 2 capsules into the lungs daily at 12 noon.         Marland Kitchen levofloxacin (LEVAQUIN) 500 MG tablet   Oral   Take 1 tablet (500 mg total) by mouth daily.   10 tablet    0     Allergies Metoprolol; Mirtazapine; Nsaids; Simvastatin; Asa; Lidoderm; and Pantoprazole  Family History  Problem Relation Age of Onset  . Heart attack Mother   . Heart attack Father     Social History Social History  Substance Use Topics  . Smoking status: Current Every Day Smoker -- 0.50 packs/day    Types: Cigarettes  . Smokeless tobacco: Current User  . Alcohol Use: No    Review of Systems  Constitutional: Negative for fever. Eyes: Negative for visual changes. ENT: Negative for sore throat Cardiovascular: Negative for chest pain. Respiratory: Positive for shortness of breath Gastrointestinal: Negative for abdominal pain, vomiting and diarrhea. Genitourinary: Negative for dysuria. Musculoskeletal: Negative for back pain. Skin: Negative for rash. Neurological: Negative for headaches or focal weakness Psychiatric: As above for anxiety    ____________________________________________   PHYSICAL EXAM:  VITAL SIGNS: ED Triage Vitals  Enc Vitals Group     BP 10/31/14 0850 101/68 mmHg     Pulse Rate 10/31/14 0850 92     Resp 10/31/14 0930 28     Temp 10/31/14 0850 97.9 F (36.6 C)     Temp Source 10/31/14 0850 Oral     SpO2 10/31/14 0850 100 %     Weight 10/31/14 0850 105 lb (47.628 kg)     Height 10/31/14 0850 '5\' 4"'$  (1.626 m)     Head Cir --      Peak Flow --      Pain Score 10/31/14 0855 7     Pain Loc --      Pain Edu? --      Excl. in [place]? --      Constitutional: Alert and oriented. No acute distress Eyes: Conjunctivae are normal.  ENT   Head: Normocephalic and atraumatic.   Mouth/Throat: Mucous membranes are moist. Cardiovascular: Normal rate, regular rhythm. Normal and symmetric distal pulses are present in all extremities. No murmurs, rubs, or gallops. Respiratory: Normal respiratory effort without tachypnea nor retractions. Breath sounds are clear and equal bilaterally.  Gastrointestinal: Soft and non-tender in all quadrants. No  distention. There is no CVA tenderness. Genitourinary: deferred Musculoskeletal: Nontender with normal range of motion in all extremities. No lower extremity tenderness nor edema. Neurologic:  Normal speech and language. No gross focal neurologic deficits are appreciated. Skin:  Skin is warm, dry and  intact. No rash noted. Psychiatric: Is convinced oxygen as bad for her but is wearing her nasal cannula now  ____________________________________________    LABS (pertinent positives/negatives)  Labs Reviewed  CBC - Abnormal; Notable for the following:    WBC 11.1 (*)    RBC 2.43 (*)    Hemoglobin 8.7 (*)    HCT 27.2 (*)    MCV 111.9 (*)    MCH 36.0 (*)    RDW 18.0 (*)    All other components within normal limits  COMPREHENSIVE METABOLIC PANEL - Abnormal; Notable for the following:    Potassium 3.2 (*)    Glucose, Bld 126 (*)    Calcium 8.4 (*)    Total Protein 6.0 (*)    ALT 10 (*)    Total Bilirubin 1.4 (*)    GFR calc non Af Amer 55 (*)    All other components within normal limits  TROPONIN I - Abnormal; Notable for the following:    Troponin I 0.06 (*)    All other components within normal limits    ____________________________________________   EKG  ED ECG REPORT I, Lavonia Drafts, the attending physician, personally viewed and interpreted this ECG.   Date: 10/31/2014  EKG Time: 10:18 AM  Rate: 92  Rhythm: normal sinus rhythm, RBBB, occasional PVC noted, unifocal  Axis: Normal axis  Intervals:right bundle branch block  ST&T Change: Nonspecific   ____________________________________________    RADIOLOGY I have personally reviewed any xrays that were ordered on this patient: Chest x-ray with retrocardiac opacity  ____________________________________________   PROCEDURES  Procedure(s) performed: none  Critical Care performed: none  ____________________________________________   INITIAL IMPRESSION / ASSESSMENT AND PLAN / ED COURSE  Pertinent labs  & imaging results that were available during my care of the patient were reviewed by me and considered in my medical decision making (see chart for details).  Overall patient is ill-appearing and in no respiratory distress. She has no tachypnea and her lung sounds are clear. Her chest x-ray shows a retrocardiac opacity which is unlikely to be pneumonia given no fever nor elevated white blood cell count however I will place her on antibiotics given her significant comorbidities. I discussed with her and her daughter the need to keep her oxygen in place. She does have a prescription present which she has not been taking I encouraged use of this whenever she felt like she did take her oxygen off.  The patient is adamant that she wants to go home and I think this is appropriate. The daughter would like to see the patient stay in the hospital but given normal oxygen saturations no tachypnea and unremarkable lab workup I think it is appropriate for the patient go home with antibiotics  ____________________________________________   FINAL CLINICAL IMPRESSION(S) / ED DIAGNOSES  Final diagnoses:  Anxiety  Shortness of breath     Lavonia Drafts, MD 10/31/14 1355

## 2014-10-31 NOTE — Discharge Instructions (Signed)
Panic Attacks °Panic attacks are sudden, short feelings of great fear or discomfort. You may have them for no reason when you are relaxed, when you are uneasy (anxious), or when you are sleeping.  °HOME CARE °· Take all your medicines as told. °· Check with your doctor before starting new medicines. °· Keep all doctor visits. °GET HELP IF: °· You are not able to take your medicines as told. °· Your symptoms do not get better. °· Your symptoms get worse. °GET HELP RIGHT AWAY IF: °· Your attacks seem different than your normal attacks. °· You have thoughts about hurting yourself or others. °· You take panic attack medicine and you have a side effect. °MAKE SURE YOU: °· Understand these instructions. °· Will watch your condition. °· Will get help right away if you are not doing well or get worse. °Document Released: 02/19/2010 Document Revised: 11/07/2012 Document Reviewed: 08/31/2012 °Exit[employer]® Patient Information ©2015 [employer]. This information is not intended to replace advice given to you by your health care provider. Make sure you discuss any questions you have with your health care provider. ° °

## 2014-10-31 NOTE — ED Notes (Addendum)
Pt discharged. I took pt out to daughters car. Pt was breathing hard, short of breath. Daughter expressed concern about discharge. Talked to pt about resp status. Told her that due to her medical condition she was going to be SOB with any exertion. Pt expressed desire to go home. Encouraged pt to take it easy at home, use her O2, take her anxiety med, call her MD, called her hospice RN, return if having breathing difficulty. Dr Corky Downs made aware.

## 2014-10-31 NOTE — ED Notes (Signed)
Ems from home. Pt with known left upper lung CA under hospice care. Pt became SOB and anxious and called 911. Pt on home O2 at nite.

## 2014-11-01 ENCOUNTER — Emergency Department

## 2014-11-01 ENCOUNTER — Encounter: Payer: Self-pay | Admitting: Emergency Medicine

## 2014-11-01 ENCOUNTER — Emergency Department
Admission: EM | Admit: 2014-11-01 | Discharge: 2014-11-01 | Disposition: A | Discharge status: Home / Self Care | Attending: Emergency Medicine | Admitting: Emergency Medicine

## 2014-11-01 DIAGNOSIS — J441 Chronic obstructive pulmonary disease with (acute) exacerbation: Secondary | ICD-10-CM | POA: Diagnosis not present

## 2014-11-01 DIAGNOSIS — Z79899 Other long term (current) drug therapy: Secondary | ICD-10-CM | POA: Diagnosis not present

## 2014-11-01 DIAGNOSIS — Z720 Tobacco use: Secondary | ICD-10-CM | POA: Insufficient documentation

## 2014-11-01 DIAGNOSIS — R000 Tachycardia, unspecified: Secondary | ICD-10-CM | POA: Insufficient documentation

## 2014-11-01 DIAGNOSIS — R0602 Shortness of breath: Secondary | ICD-10-CM | POA: Diagnosis present

## 2014-11-01 DIAGNOSIS — Z515 Encounter for palliative care: Secondary | ICD-10-CM | POA: Diagnosis not present

## 2014-11-01 DIAGNOSIS — N183 Chronic kidney disease, stage 3 (moderate): Secondary | ICD-10-CM | POA: Insufficient documentation

## 2014-11-01 DIAGNOSIS — I129 Hypertensive chronic kidney disease with stage 1 through stage 4 chronic kidney disease, or unspecified chronic kidney disease: Secondary | ICD-10-CM | POA: Insufficient documentation

## 2014-11-01 DIAGNOSIS — Z792 Long term (current) use of antibiotics: Secondary | ICD-10-CM | POA: Diagnosis not present

## 2014-11-01 DIAGNOSIS — Z7951 Long term (current) use of inhaled steroids: Secondary | ICD-10-CM | POA: Insufficient documentation

## 2014-11-01 DIAGNOSIS — R0600 Dyspnea, unspecified: Secondary | ICD-10-CM

## 2014-11-01 LAB — CBC WITH DIFFERENTIAL/PLATELET
BASOS ABS: 0.1 10*3/uL (ref 0–0.1)
BASOS PCT: 1 %
Eosinophils Absolute: 0.3 10*3/uL (ref 0–0.7)
Eosinophils Relative: 3 %
HEMATOCRIT: 29.7 % — AB (ref 35.0–47.0)
HEMOGLOBIN: 9.8 g/dL — AB (ref 12.0–16.0)
Lymphocytes Relative: 31 %
Lymphs Abs: 3 10*3/uL (ref 1.0–3.6)
MCH: 37.9 pg — ABNORMAL HIGH (ref 26.0–34.0)
MCHC: 33 g/dL (ref 32.0–36.0)
MCV: 114.6 fL — ABNORMAL HIGH (ref 80.0–100.0)
Monocytes Absolute: 0.7 10*3/uL (ref 0.2–0.9)
Monocytes Relative: 7 %
NEUTROS ABS: 5.7 10*3/uL (ref 1.4–6.5)
NEUTROS PCT: 58 %
Platelets: 254 10*3/uL (ref 150–440)
RBC: 2.59 MIL/uL — AB (ref 3.80–5.20)
RDW: 19.5 % — ABNORMAL HIGH (ref 11.5–14.5)
WBC: 9.8 10*3/uL (ref 3.6–11.0)

## 2014-11-01 LAB — COMPREHENSIVE METABOLIC PANEL
ALBUMIN: 3.8 g/dL (ref 3.5–5.0)
ALK PHOS: 90 U/L (ref 38–126)
ALT: 12 U/L — AB (ref 14–54)
ANION GAP: 6 (ref 5–15)
AST: 17 U/L (ref 15–41)
BUN: 14 mg/dL (ref 6–20)
CALCIUM: 8.8 mg/dL — AB (ref 8.9–10.3)
CO2: 28 mmol/L (ref 22–32)
Chloride: 105 mmol/L (ref 101–111)
Creatinine, Ser: 1.19 mg/dL — ABNORMAL HIGH (ref 0.44–1.00)
GFR calc Af Amer: 49 mL/min — ABNORMAL LOW (ref >60)
GFR calc non Af Amer: 43 mL/min — ABNORMAL LOW (ref >60)
GLUCOSE: 187 mg/dL — AB (ref 65–99)
Potassium: 4.3 mmol/L (ref 3.5–5.1)
SODIUM: 139 mmol/L (ref 135–145)
Total Bilirubin: 1.4 mg/dL — ABNORMAL HIGH (ref 0.3–1.2)
Total Protein: 6.3 g/dL — ABNORMAL LOW (ref 6.5–8.1)

## 2014-11-01 LAB — TROPONIN I: Troponin I: 0.07 ng/mL — ABNORMAL HIGH (ref <0.031)

## 2014-11-01 LAB — BRAIN NATRIURETIC PEPTIDE: B Natriuretic Peptide: >4500 pg/mL — ABNORMAL HIGH (ref 0.0–100.0)

## 2014-11-01 MED ORDER — IPRATROPIUM-ALBUTEROL 0.5-2.5 (3) MG/3ML IN SOLN
RESPIRATORY_TRACT | Status: AC
  Administered 2014-11-01: 3 mL via RESPIRATORY_TRACT
  Filled 2014-11-01: qty 9

## 2014-11-01 MED ORDER — IPRATROPIUM-ALBUTEROL 0.5-2.5 (3) MG/3ML IN SOLN
3.0000 mL | Freq: Once | RESPIRATORY_TRACT | Status: AC
  Administered 2014-11-01: 3 mL via RESPIRATORY_TRACT

## 2014-11-01 NOTE — ED Notes (Signed)
Pt presents to ED via EMS from personal home with c/o of difficulty breathing. EMS states pt was seen in the ER yesterday for same presenting sx. EMS states pt is a hospice pt. EMS states hospice nurse called to scene and administered morphine and ativan prior to EMS arrival. Pt has a hospice diagnosis of lung cancer. EMS states pt has DNR status. Pt arrived to ER alert and oriented x3, moaning and stating "I can't breath". MD at bedside.

## 2014-11-01 NOTE — ED Provider Notes (Signed)
[facility] Emergency Department Provider Note  ____________________________________________  Time seen: Approximately 225 AM  I have reviewed the triage vital signs and the nursing notes.   HISTORY  Chief Complaint Shortness of Breath    HPI Kara Clark is a 80 y.o. female with a history of end-stage lung cancer who comes in today with shortness of breath. The patient was seen in the hospital earlier for the same symptoms. The patient is a hospice patient. The patient reports that she has been feeling short of breath today which is not normal for her. The hospice nurse was called before EMS arrived and did give the patient 2 doses of morphine and some Ativan.The patient also has a history of COPD and CHF. According to EMS when they arrived the patient was 94% on room air but was reporting that she could not catch her breath. The patient reports that she does have some chest pain in her mouth feels dry. She has been on hospice for 3-4 months but reports that she has never had shortness of breath like this before. The patient has not had any fever, has not had any cough. The patient has not done any inhalers at home but does live at home alone. The patient was brought in by EMS for evaluation. The patient reports that she is unable to answer anymore questions that she is tired.   Past Medical History  Diagnosis Date  . History of GI bleed 2010  . CAD (coronary artery disease)   . Cervical cancer ([place])   . CHF (congestive heart failure) ([place])   . COPD (chronic obstructive pulmonary disease) ([place])   . Hyperlipidemia   . Lung mass     30m pulmonary nodule  . Eosinophilia     prior BCR neg, FISH for PDGF neg  . History of hemoptysis   . Hydronephrosis of right kidney 09/2011  . B12 deficiency anemia   . Chronic anemia   . History of pneumonia 12/2013  . History of MI (myocardial infarction) 12/2012  . History of respiratory failure 12/2012  . Duodenitis    . History of ataxia     Patient Active Problem List   Diagnosis Date Noted  . DNAR (do not attempt resuscitation) 05/28/2014  . Lung mass 05/28/2014  . Gastric catarrh 01/08/2014  . 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 A-fib 07/30/2013  . Absolute anemia 06/25/2013  . Cramp in muscle 06/25/2013  . Chronic kidney disease (CKD), stage III (moderate) 06/24/2013  . COPD, moderate 06/24/2013  . CAD in native artery 06/24/2013  . Eosinophil count raised 06/24/2013  . Benign essential HTN 06/24/2013  . HLD (hyperlipidemia) 06/24/2013  . Avitaminosis D 06/24/2013    Past Surgical History  Procedure Laterality Date  . Athrectomy directional coronary  03/22/2008  . Coronary angioplasty with stent placement  03/22/2008  . Cataract extraction  2009  . Right oophorectomy  1968  . Total abdominal hysterectomy  1968  . Esophagogastroduodenoscopy  10/2013  . Colonoscopy  10/2013    Current Outpatient Rx  Name  Route  Sig  Dispense  Refill  . acetaminophen (TYLENOL) 500 MG tablet   Oral   Take 1,000 mg by mouth every 4 (four) hours as needed for mild pain or headache.         . albuterol (PROVENTIL HFA;VENTOLIN HFA) 108 (90 BASE) MCG/ACT inhaler   Inhalation   Inhale 1 puff into the lungs every 4 (four) hours as needed for  wheezing or shortness of breath.         . ALPRAZolam (XANAX) 0.25 MG tablet   Oral   Take 0.25 mg by mouth 3 (three) times daily as needed for anxiety.         . Cholecalciferol (VITAMIN D3) 2000 UNITS TABS   Oral   Take 1 tablet by mouth daily.         . cyanocobalamin (,VITAMIN B-12,) 1000 MCG/ML injection   Intramuscular   Inject 1,000 mcg into the muscle every 30 (thirty) days.         . furosemide (LASIX) 20 MG tablet   Oral   Take 20 mg by mouth daily.         Marland Kitchen levofloxacin (LEVAQUIN) 500 MG tablet   Oral   Take 1 tablet (500 mg total) by mouth daily.   10 tablet   0   . magnesium oxide (MAG-OX) 400 MG tablet   Oral   Take 400 mg  by mouth daily.         . nitroGLYCERIN (NITROSTAT) 0.4 MG SL tablet   Sublingual   Place 0.4 mg under the tongue every 5 (five) minutes as needed for chest pain.         . ranitidine (ZANTAC) 150 MG tablet   Oral   Take 150 mg by mouth 2 (two) times daily.         . roflumilast (DALIRESP) 500 MCG TABS tablet   Oral   Take 500 mcg by mouth daily.         . Tiotropium Bromide Monohydrate (SPIRIVA RESPIMAT) 2.5 MCG/ACT AERS   Inhalation   Inhale 2 capsules into the lungs daily at 12 noon.           Allergies Metoprolol; Mirtazapine; Nsaids; Simvastatin; Asa; Lidoderm; and Pantoprazole  Family History  Problem Relation Age of Onset  . Heart attack Mother   . Heart attack Father     Social History Social History  Substance Use Topics  . Smoking status: Current Every Day Smoker -- 0.50 packs/day    Types: Cigarettes  . Smokeless tobacco: Current User  . Alcohol Use: No    Review of Systems Constitutional: No fever/chills Eyes: No visual changes. ENT: No sore throat. Cardiovascular:  chest pain. Respiratory:  shortness of breath. Gastrointestinal: No abdominal pain.  No nausea, no vomiting.  No diarrhea.  No constipation. Genitourinary: Negative for dysuria. Musculoskeletal: Negative for back pain. Skin: Negative for rash. Neurological: Negative for headaches, focal weakness or numbness.  10-point ROS otherwise negative.  ____________________________________________   PHYSICAL EXAM:  VITAL SIGNS: ED Triage Vitals  Enc Vitals Group     BP 11/01/14 0220 117/65 mmHg     Pulse Rate 11/01/14 0220 111     Resp 11/01/14 0220 33     Temp 11/01/14 0220 98 F (36.7 C)     Temp Source 11/01/14 0220 Oral     SpO2 11/01/14 0220 96 %     Weight 11/01/14 0220 125 lb (56.7 kg)     Height 11/01/14 0220 '5\' 5"'$  (1.651 m)     Head Cir --      Peak Flow --      Pain Score 11/01/14 0221 10     Pain Loc --      Pain Edu? --      Excl. in [place]? --      Constitutional: Somnolent, Ill appearing, cachectic and in moderate distress. Eyes: Conjunctivae are normal.  PERRL. EOMI. Head: Atraumatic. Nose: No congestion/rhinnorhea. Mouth/Throat: Mucous membranes are moist.  Oropharynx non-erythematous. Cardiovascular: Tachycardia, regular rhythm. Grossly normal heart sounds.  Good peripheral circulation. Respiratory: Increased respiratory effort with some mild expiratory wheezes bilaterally. Gastrointestinal: Soft and nontender. No distention. No abdominal bruits. No CVA tenderness. Musculoskeletal: No lower extremity tenderness nor edema.  . Neurologic:  Normal speech and language.  Skin:  Skin is warm, dry and intact.  Psychiatric: Mood and affect are normal.   ____________________________________________   LABS (all labs ordered are listed, but only abnormal results are displayed)  Labs Reviewed  CBC WITH DIFFERENTIAL/PLATELET - Abnormal; Notable for the following:    RBC 2.59 (*)    Hemoglobin 9.8 (*)    HCT 29.7 (*)    MCV 114.6 (*)    MCH 37.9 (*)    RDW 19.5 (*)    All other components within normal limits  BRAIN NATRIURETIC PEPTIDE - Abnormal; Notable for the following:    B Natriuretic Peptide >4500.0 (*)    All other components within normal limits  COMPREHENSIVE METABOLIC PANEL - Abnormal; Notable for the following:    Glucose, Bld 187 (*)    Creatinine, Ser 1.19 (*)    Calcium 8.8 (*)    Total Protein 6.3 (*)    ALT 12 (*)    Total Bilirubin 1.4 (*)    GFR calc non Af Amer 43 (*)    GFR calc Af Amer 49 (*)    All other components within normal limits  TROPONIN I - Abnormal; Notable for the following:    Troponin I 0.07 (*)    All other components within normal limits   ____________________________________________  EKG  ED ECG REPORT I, Loney Hering, the attending physician, personally viewed and interpreted this ECG.   Date: 11/01/2014  EKG Time: 229  Rate: 106  Rhythm: sinus tachycardia  Axis:  right axis  Intervals:wide qrs  ST&T Change: none  ____________________________________________  RADIOLOGY  CXR: Stable cardiomegaly, small pleural effusions and versus a retrocardiac consolidation, COPD, stable spiculated left hilar nodular density ____________________________________________   PROCEDURES  Procedure(s) performed: None  Critical Care performed: No  ____________________________________________   INITIAL IMPRESSION / ASSESSMENT AND PLAN / ED COURSE  Pertinent labs & imaging results that were available during my care of the patient were reviewed by me and considered in my medical decision making (see chart for details).  This is 80 year old female with a history of end-stage COPD, CHF and lung cancer who comes in today with shortness of breath. The patient was seen earlier today with the same symptoms. The patient according to her daughter started feeling short of breath and had a heart rate in the 140s at home. The patient's daughter is concerned as the patient does live at home alone. The hospice nurse is aware of the patient's status and does have a bed at the hospice home for the patient. When I asked the patient's daughter what more they wanted [place] to do if they wanted [place] to treat her or send her to hospice home the patient's daughter reports that she did not feel anything else could be done for her mother at this time and she wants her to be sent to hospice home. We spoke to the hospice house and there is a bed for the patient. The patient will be discharged to hospice house for further hospice care. The patient did receive a liter of normal saline as well as a DuoNeb for her  shortness of breath. Her tachycardia did resolve as did her wheezing. ____________________________________________   FINAL CLINICAL IMPRESSION(S) / ED DIAGNOSES  Final diagnoses:  Dyspnea  Hospice care      Loney Hering, MD 11/01/14 [phone]

## 2014-11-01 NOTE — Discharge Instructions (Signed)
Hospice Hospice is a service that is designed to provide people who are terminally ill and their families with medical, spiritual, and psychological support. Its aim is to improve your quality of life by keeping you as alert and comfortable as possible. Hospice is performed by a team of health care professionals and volunteers who:  Help keep you comfortable. Hospice can be provided in your home or in a homelike setting. The hospice staff works with your family and friends to help meet your needs. You will enjoy the support of loved ones by receiving much of your basic care from family and friends.  Provide pain relief and manage your symptoms. The staff supply all necessary medicines and equipment.  Provide companionship when you are alone.  Allow you and your family to rest. They may do light housekeeping, prepare meals, and run errands.  Provide counseling. They will make sure your emotional, spiritual, and social needs and those of your family are being met.  Provide spiritual care. Spiritual care is individualized to meet your needs and your family's needs. It may involve helping you look at what death means to you, say goodbye, or perform a specific religious ceremony or ritual. Hospice teams often include:  A nurse.  A doctor.  Social workers.  Religious leaders (such as a Clinical biochemist).  Trained volunteers. WHEN SHOULD HOSPICE CARE BEGIN? Most people who use hospice are believed to have fewer than 6 months to live. Your family and health care providers can help you decide when hospice services should begin. If your condition improves, you may discontinue the program. WHAT [place]? Most hospice programs are run by nonprofit, independent organizations. Some are affiliated with hospitals, nursing homes, or home health care agencies. Hospice programs can take place in the home or at a hospice center, hospital, or skilled nursing facility. When choosing  a hospice program, ask the following questions:  What services are available to me?  What services are offered to my loved ones?  How involved are my loved ones?  How involved is my health care provider?  Who makes up the hospice care team? How are they trained or screened?  How will my pain and symptoms be managed?  If my circumstances change, can the services be provided in a different setting, such as my home or in the hospital?  Is the program reviewed and licensed by the state or certified in some other way? WHERE CAN I LEARN MORE ABOUT HOSPICE? You can learn about existing hospice programs in your area from your health care providers. You can also read more about hospice online. The websites of the following organizations contain helpful information:  [facility]).  The Hospice Association of America ([place]).  The [place].  The American Cancer Society (ACS).  [facility]. Document Released: 05/06/2003 Document Revised: 01/22/2013 Document Reviewed: 11/27/2012 [facility] Patient Information 2015 [place], [place]. This information is not intended to replace advice given to you by your health care provider. Make sure you discuss any questions you have with your health care provider.  Shortness of Breath Shortness of breath means you have trouble breathing. It could also mean that you have a medical problem. You should get immediate medical care for shortness of breath. CAUSES   Not enough oxygen in the air such as with high altitudes or a smoke-filled room.  Certain lung diseases, infections, or problems.  Heart disease or conditions, such as angina or  heart failure.  Low red blood cells (anemia).  Poor physical fitness, which can cause shortness of breath when you exercise.  Chest or back injuries or stiffness.  Being overweight.  Smoking.  Anxiety, which can make you feel like you are not getting  enough air. DIAGNOSIS  Serious medical problems can often be found during your physical exam. Tests may also be done to determine why you are having shortness of breath. Tests may include:  Chest X-rays.  Lung function tests.  Blood tests.  An electrocardiogram (ECG).  An ambulatory electrocardiogram. An ambulatory ECG records your heartbeat patterns over a 24-hour period.  Exercise testing.  A transthoracic echocardiogram (TTE). During echocardiography, sound waves are used to evaluate how blood flows through your heart.  A transesophageal echocardiogram (TEE).  Imaging scans. Your health care provider may not be able to find a cause for your shortness of breath after your exam. In this case, it is important to have a follow-up exam with your health care provider as directed.  TREATMENT  Treatment for shortness of breath depends on the cause of your symptoms and can vary greatly. HOME CARE INSTRUCTIONS   Do not smoke. Smoking is a common cause of shortness of breath. If you smoke, ask for help to quit.  Avoid being around chemicals or things that may bother your breathing, such as paint fumes and dust.  Rest as needed. Slowly resume your usual activities.  If medicines were prescribed, take them as directed for the full length of time directed. This includes oxygen and any inhaled medicines.  Keep all follow-up appointments as directed by your health care provider. SEEK MEDICAL CARE IF:   Your condition does not improve in the time expected.  You have a hard time doing your normal activities even with rest.  You have any new symptoms. SEEK IMMEDIATE MEDICAL CARE IF:   Your shortness of breath gets worse.  You feel light-headed, faint, or develop a cough not controlled with medicines.  You start coughing up blood.  You have pain with breathing.  You have chest pain or pain in your arms, shoulders, or abdomen.  You have a fever.  You are unable to walk up stairs  or exercise the way you normally do. MAKE SURE YOU:  Understand these instructions.  Will watch your condition.  Will get help right away if you are not doing well or get worse. Document Released: 10/12/2000 Document Revised: 01/22/2013 Document Reviewed: 04/04/2011 [facility] Patient Information 2015 [place], [place]. This information is not intended to replace advice given to you by your health care provider. Make sure you discuss any questions you have with your health care provider.

## 2014-11-01 NOTE — ED Notes (Signed)
Respirations even and unlabored. Skin warm/dry. Discharge instructions reviewed with patient at this time. Patient given opportunity to voice concerns/ask questions. IV removed per policy and band-aid applied to site. Patient discharged at this time and left Emergency Department, via ambulance.

## 2014-11-01 NOTE — ED Notes (Signed)
Spoke with Horris Latino, Hospice RN on pt current plan of care.

## 2014-11-01 NOTE — ED Notes (Signed)
Spoke with Luellen Pucker, RN at [facility] for hand-off report.

## 2014-12-02 DEATH — deceased

## 2016-08-17 IMAGING — CT CT HEAD W/O CM
1 series · 16 of 30 positions shown, 20 images · non-contrast
Comparison: None.

CLINICAL DATA: Weakness and anemia.

EXAM:
CT HEAD WITHOUT CONTRAST
TECHNIQUE: Contiguous axial images were obtained from the base of the skull
through the vertex without intravenous contrast.

[Series 2: head wo · axial · 0.41mm/px · z∈[+540,+666]mm · 16 of 32 slices shown, 20 images]
[im 2/32  brain]
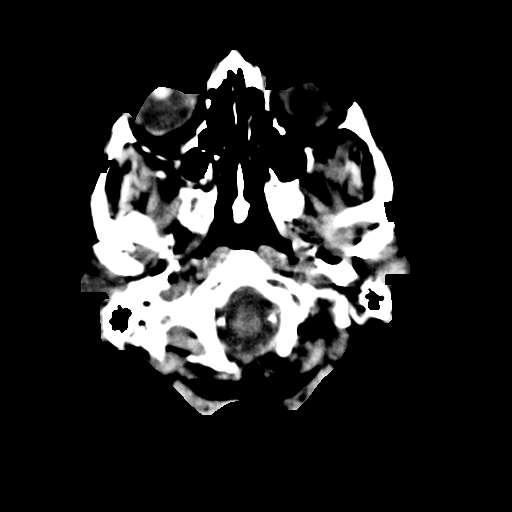
[im 2/32  bone]
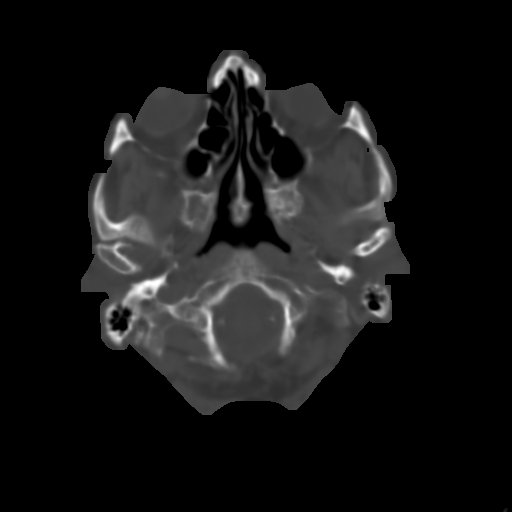
[im 4/32  brain]
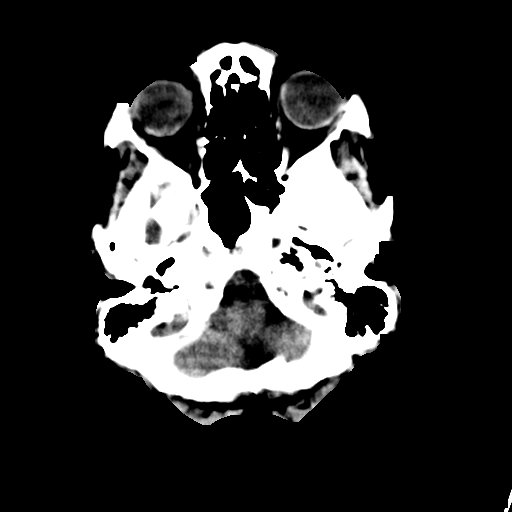
[im 6/32  brain]
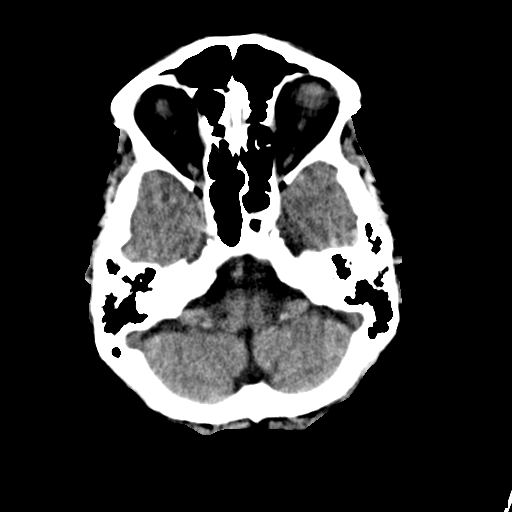
[im 8/32  brain]
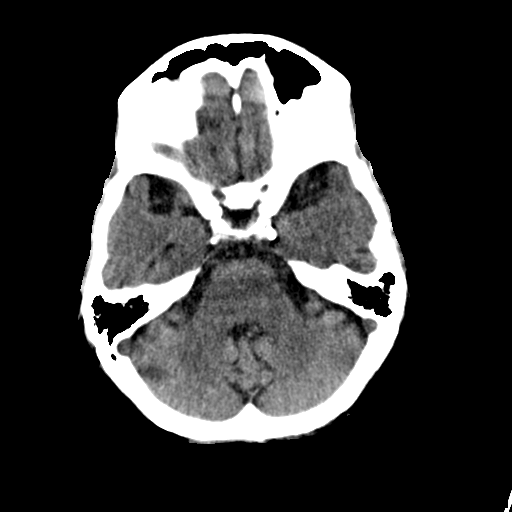
[im 9/32  brain]
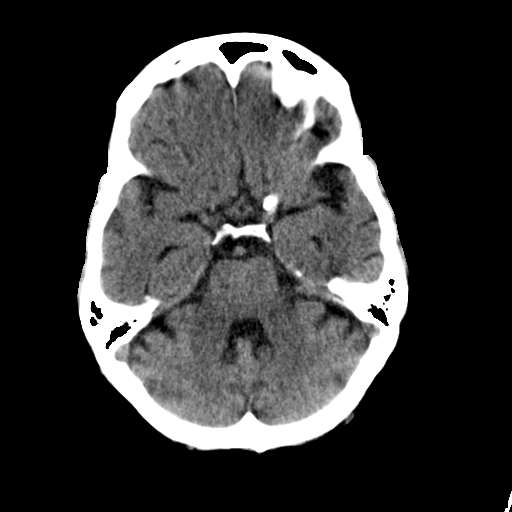
[im 9/32  bone]
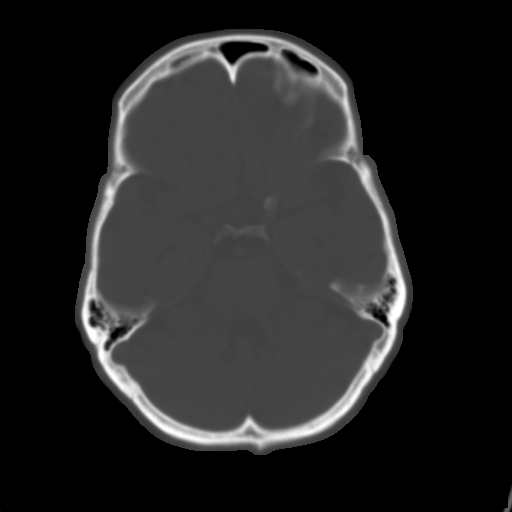
[im 11/32  brain]
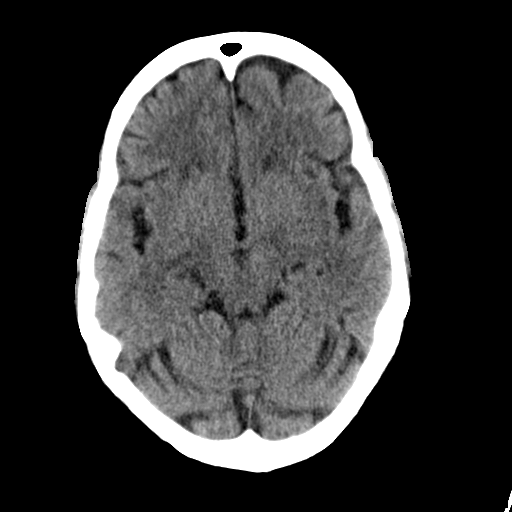
[im 13/32  brain]
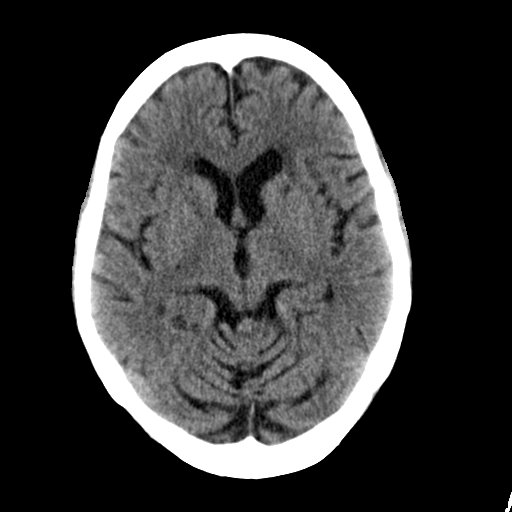
[im 15/32  brain]
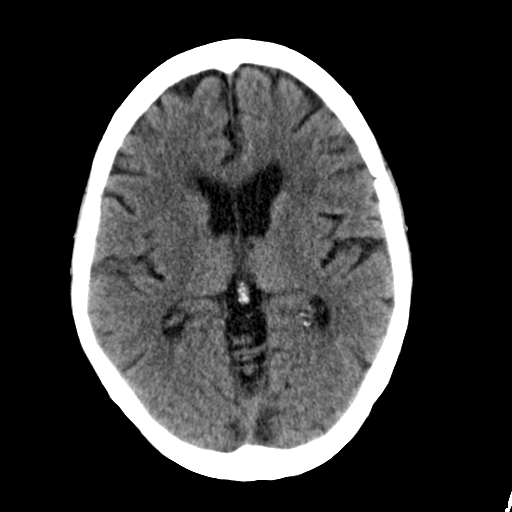
[im 17/32  brain]
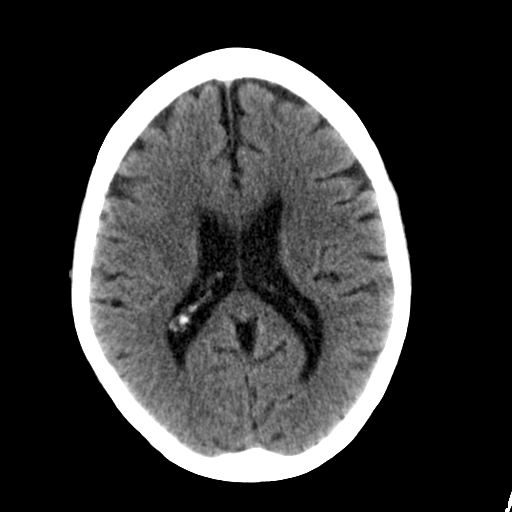
[im 17/32  bone]
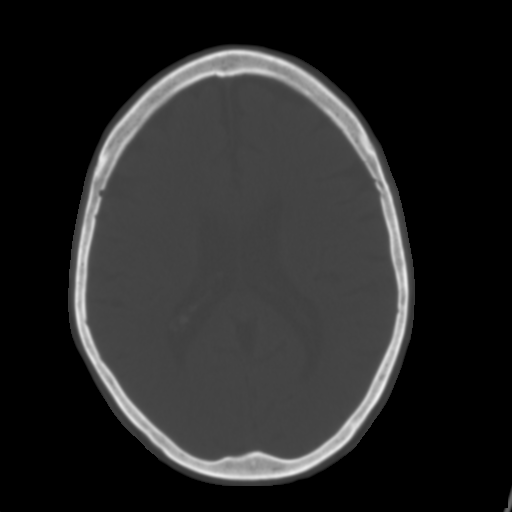
[im 19/32  brain]
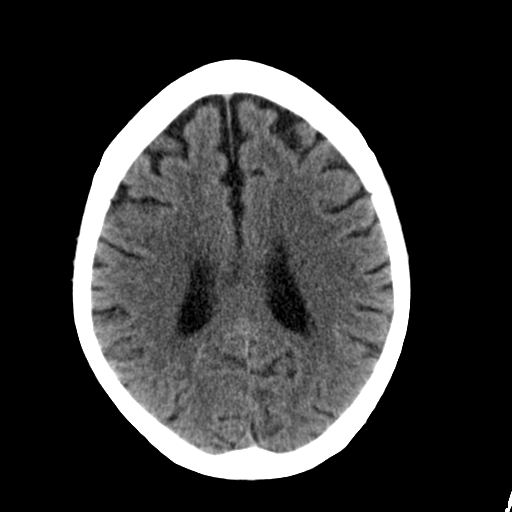
[im 21/32  brain]
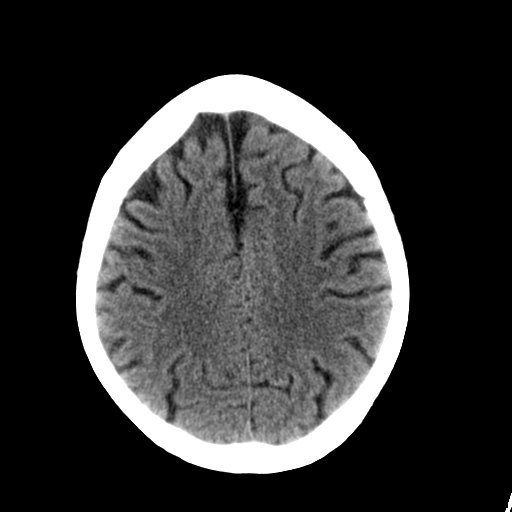
[im 23/32  brain]
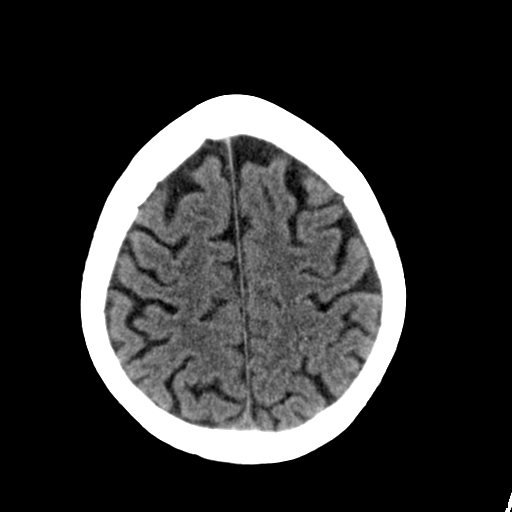
[im 24/32  brain]
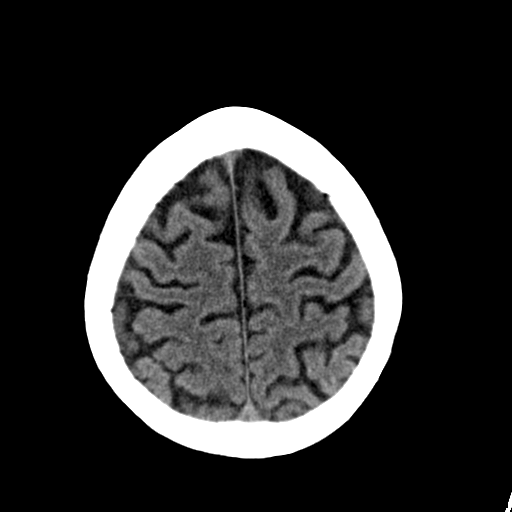
[im 24/32  bone]
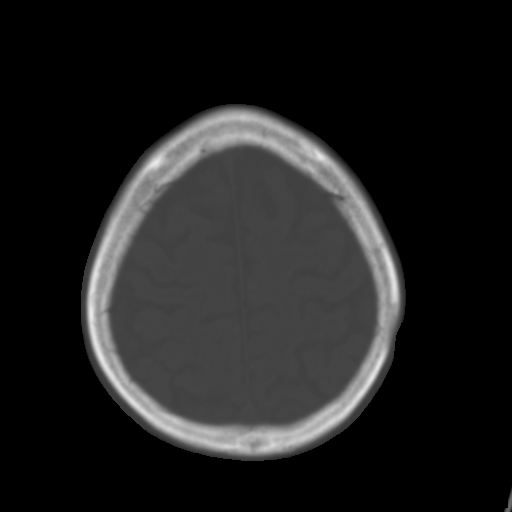
[im 26/32  brain]
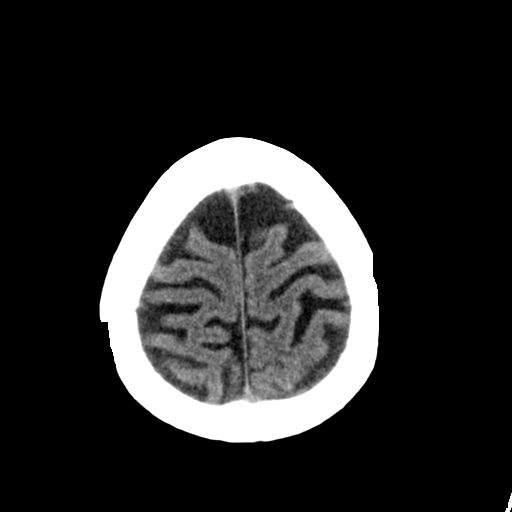
[im 28/32  brain]
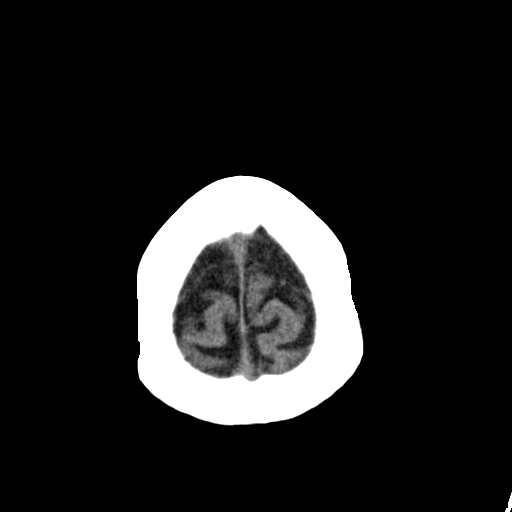
[im 30/32  brain]
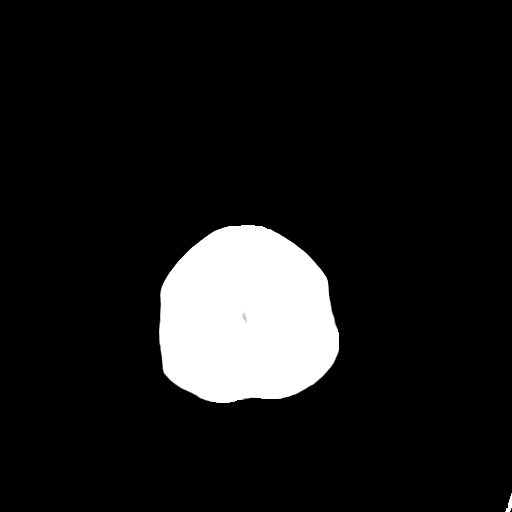

[16 of 30 positions shown; findings below may reference images not displayed]

FINDINGS: There is no intracranial hemorrhage, mass or evidence of acute
infarction. There is moderate generalized atrophy. There is moderate
chronic microvascular ischemic change. There is no significant
extra-axial fluid collection.

No acute intracranial findings are evident. Visible paranasal
sinuses are clear.
IMPRESSION: Atrophy and moderate chronic microvascular changes. No acute
intracranial findings.
